# Patient Record
Sex: Male | Born: 1970 | Race: Black or African American | Hispanic: No | Marital: Married | Smoking: Never smoker
Health system: Southern US, Community
[De-identification: ages and names within clinical notes are randomized; demographics above are authoritative.]

## PROBLEM LIST (undated history)

## (undated) DIAGNOSIS — R011 Cardiac murmur, unspecified: Secondary | ICD-10-CM

## (undated) DIAGNOSIS — Z8719 Personal history of other diseases of the digestive system: Secondary | ICD-10-CM

## (undated) DIAGNOSIS — K219 Gastro-esophageal reflux disease without esophagitis: Secondary | ICD-10-CM

## (undated) DIAGNOSIS — M1990 Unspecified osteoarthritis, unspecified site: Secondary | ICD-10-CM

## (undated) HISTORY — PX: APPENDECTOMY: SHX54

## (undated) HISTORY — PX: WRIST SURGERY: SHX841

## (undated) HISTORY — PX: SHOULDER SURGERY: SHX246

## (undated) HISTORY — PX: FOOT SURGERY: SHX648

---

## 2007-02-05 ENCOUNTER — Emergency Department: Payer: Self-pay | Admitting: Unknown Physician Specialty

## 2007-06-20 ENCOUNTER — Emergency Department: Payer: Self-pay | Admitting: Emergency Medicine

## 2007-12-16 ENCOUNTER — Emergency Department: Payer: Self-pay | Admitting: Emergency Medicine

## 2008-01-14 ENCOUNTER — Emergency Department: Payer: Self-pay | Admitting: Emergency Medicine

## 2008-04-20 ENCOUNTER — Emergency Department: Payer: Self-pay | Admitting: Emergency Medicine

## 2009-11-07 ENCOUNTER — Emergency Department: Payer: Self-pay | Admitting: Emergency Medicine

## 2010-09-03 ENCOUNTER — Emergency Department: Payer: Self-pay | Admitting: Emergency Medicine

## 2011-05-02 ENCOUNTER — Emergency Department: Payer: Self-pay | Admitting: Internal Medicine

## 2012-02-06 ENCOUNTER — Emergency Department: Payer: Self-pay | Admitting: Emergency Medicine

## 2012-12-25 ENCOUNTER — Emergency Department: Payer: Self-pay | Admitting: Emergency Medicine

## 2013-01-29 ENCOUNTER — Emergency Department: Payer: Self-pay | Admitting: Emergency Medicine

## 2013-06-24 ENCOUNTER — Emergency Department: Payer: Self-pay | Admitting: Emergency Medicine

## 2013-11-15 ENCOUNTER — Emergency Department: Payer: Self-pay | Admitting: Emergency Medicine

## 2014-10-10 ENCOUNTER — Emergency Department: Payer: Self-pay | Admitting: Emergency Medicine

## 2015-02-10 ENCOUNTER — Emergency Department
Admission: EM | Admit: 2015-02-10 | Discharge: 2015-02-10 | Disposition: A | Discharge status: Home / Self Care | Payer: No Typology Code available for payment source | Attending: Emergency Medicine | Admitting: Emergency Medicine

## 2015-02-10 ENCOUNTER — Emergency Department: Payer: No Typology Code available for payment source

## 2015-02-10 DIAGNOSIS — Y998 Other external cause status: Secondary | ICD-10-CM | POA: Insufficient documentation

## 2015-02-10 DIAGNOSIS — Y92410 Unspecified street and highway as the place of occurrence of the external cause: Secondary | ICD-10-CM | POA: Diagnosis not present

## 2015-02-10 DIAGNOSIS — S199XXA Unspecified injury of neck, initial encounter: Secondary | ICD-10-CM | POA: Diagnosis present

## 2015-02-10 DIAGNOSIS — Y9389 Activity, other specified: Secondary | ICD-10-CM | POA: Diagnosis not present

## 2015-02-10 DIAGNOSIS — S139XXA Sprain of joints and ligaments of unspecified parts of neck, initial encounter: Secondary | ICD-10-CM | POA: Diagnosis not present

## 2015-02-10 MED ORDER — OXYCODONE-ACETAMINOPHEN 5-325 MG PO TABS
1.0000 | ORAL_TABLET | Freq: Once | ORAL | Status: AC
  Administered 2015-02-10: 1 via ORAL

## 2015-02-10 MED ORDER — IBUPROFEN 800 MG PO TABS: 800.0000 mg | ORAL_TABLET | Freq: Three times a day (TID) | ORAL | Status: DC | PRN

## 2015-02-10 MED ORDER — HYDROCODONE-ACETAMINOPHEN 5-325 MG PO TABS: 1.0000 | ORAL_TABLET | ORAL | Status: DC | PRN

## 2015-02-10 MED ORDER — KETOROLAC TROMETHAMINE 60 MG/2ML IM SOLN
INTRAMUSCULAR | Status: AC
  Filled 2015-02-10: qty 2

## 2015-02-10 MED ORDER — OXYCODONE-ACETAMINOPHEN 5-325 MG PO TABS
ORAL_TABLET | ORAL | Status: AC
  Filled 2015-02-10: qty 1

## 2015-02-10 MED ORDER — KETOROLAC TROMETHAMINE 60 MG/2ML IM SOLN
60.0000 mg | Freq: Once | INTRAMUSCULAR | Status: AC
  Administered 2015-02-10: 60 mg via INTRAMUSCULAR

## 2015-02-10 MED ORDER — CYCLOBENZAPRINE HCL 10 MG PO TABS: 10.0000 mg | ORAL_TABLET | Freq: Three times a day (TID) | ORAL | Status: AC | PRN

## 2015-02-10 NOTE — ED Notes (Signed)
Pt reports c/o "constant ringing in ears". Pt denies blurry vision and nausea at this time.
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
## 2015-02-15 ENCOUNTER — Emergency Department
Admission: EM | Admit: 2015-02-15 | Discharge: 2015-02-15 | Disposition: A | Discharge status: Home / Self Care | Payer: No Typology Code available for payment source | Attending: Student | Admitting: Student

## 2015-02-15 ENCOUNTER — Encounter: Payer: Self-pay | Admitting: Emergency Medicine

## 2015-02-15 ENCOUNTER — Emergency Department: Payer: No Typology Code available for payment source

## 2015-02-15 DIAGNOSIS — Z79899 Other long term (current) drug therapy: Secondary | ICD-10-CM | POA: Insufficient documentation

## 2015-02-15 DIAGNOSIS — S3992XD Unspecified injury of lower back, subsequent encounter: Secondary | ICD-10-CM | POA: Insufficient documentation

## 2015-02-15 DIAGNOSIS — M545 Low back pain, unspecified: Secondary | ICD-10-CM

## 2015-02-15 MED ORDER — TRAMADOL HCL 50 MG PO TABS: 50.0000 mg | ORAL_TABLET | Freq: Four times a day (QID) | ORAL | Status: DC | PRN

## 2015-02-15 MED ORDER — METHOCARBAMOL 750 MG PO TABS: 1500.0000 mg | ORAL_TABLET | Freq: Four times a day (QID) | ORAL | Status: DC
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
## 2015-06-12 ENCOUNTER — Encounter: Payer: Self-pay | Admitting: Emergency Medicine

## 2015-06-12 ENCOUNTER — Emergency Department
Admission: EM | Admit: 2015-06-12 | Discharge: 2015-06-12 | Disposition: A | Discharge status: Home / Self Care | Payer: Self-pay | Attending: Emergency Medicine | Admitting: Emergency Medicine

## 2015-06-12 ENCOUNTER — Emergency Department: Payer: Self-pay

## 2015-06-12 DIAGNOSIS — R112 Nausea with vomiting, unspecified: Secondary | ICD-10-CM | POA: Insufficient documentation

## 2015-06-12 DIAGNOSIS — R197 Diarrhea, unspecified: Secondary | ICD-10-CM | POA: Insufficient documentation

## 2015-06-12 DIAGNOSIS — Z79899 Other long term (current) drug therapy: Secondary | ICD-10-CM | POA: Insufficient documentation

## 2015-06-12 DIAGNOSIS — R1031 Right lower quadrant pain: Secondary | ICD-10-CM | POA: Insufficient documentation

## 2015-06-12 HISTORY — DX: Personal history of other diseases of the digestive system: Z87.19

## 2015-06-12 LAB — COMPREHENSIVE METABOLIC PANEL
ALT: 18 U/L (ref 17–63)
AST: 28 U/L (ref 15–41)
Albumin: 4.3 g/dL (ref 3.5–5.0)
Alkaline Phosphatase: 64 U/L (ref 38–126)
Anion gap: 3 — ABNORMAL LOW (ref 5–15)
BUN: 14 mg/dL (ref 6–20)
CO2: 32 mmol/L (ref 22–32)
Calcium: 9.5 mg/dL (ref 8.9–10.3)
Chloride: 107 mmol/L (ref 101–111)
Creatinine, Ser: 1.18 mg/dL (ref 0.61–1.24)
GFR calc Af Amer: >60 mL/min (ref >60)
GFR calc non Af Amer: >60 mL/min (ref >60)
Glucose, Bld: 97 mg/dL (ref 65–99)
Potassium: 4.7 mmol/L (ref 3.5–5.1)
Sodium: 142 mmol/L (ref 135–145)
Total Bilirubin: 0.7 mg/dL (ref 0.3–1.2)
Total Protein: 7.2 g/dL (ref 6.5–8.1)

## 2015-06-12 LAB — CBC
HCT: 43.2 % (ref 40.0–52.0)
Hemoglobin: 14.1 g/dL (ref 13.0–18.0)
MCH: 28.8 pg (ref 26.0–34.0)
MCHC: 32.5 g/dL (ref 32.0–36.0)
MCV: 88.4 fL (ref 80.0–100.0)
Platelets: 170 10*3/uL (ref 150–440)
RBC: 4.89 MIL/uL (ref 4.40–5.90)
RDW: 13.6 % (ref 11.5–14.5)
WBC: 3.3 10*3/uL — ABNORMAL LOW (ref 3.8–10.6)

## 2015-06-12 LAB — URINALYSIS COMPLETE WITH MICROSCOPIC (ARMC ONLY)
Bacteria, UA: NONE SEEN
Bilirubin Urine: NEGATIVE
Glucose, UA: NEGATIVE mg/dL
Ketones, ur: NEGATIVE mg/dL
Leukocytes, UA: NEGATIVE
Nitrite: NEGATIVE
Protein, ur: NEGATIVE mg/dL
Specific Gravity, Urine: 1.021 (ref 1.005–1.030)
Squamous Epithelial / LPF: NONE SEEN
pH: 6 (ref 5.0–8.0)

## 2015-06-12 LAB — LIPASE, BLOOD: Lipase: 34 U/L (ref 22–51)

## 2015-06-12 MED ORDER — IOHEXOL 240 MG/ML SOLN
25.0000 mL | Freq: Once | INTRAMUSCULAR | Status: AC | PRN
  Administered 2015-06-12: 50 mL via ORAL

## 2015-06-12 MED ORDER — HYDROCODONE-ACETAMINOPHEN 5-325 MG PO TABS: 1.0000 | ORAL_TABLET | ORAL | Status: DC | PRN

## 2015-06-12 MED ORDER — IOHEXOL 300 MG/ML  SOLN
100.0000 mL | Freq: Once | INTRAMUSCULAR | Status: AC | PRN
  Administered 2015-06-12: 100 mL via INTRAVENOUS

## 2015-06-12 NOTE — ED Provider Notes (Signed)
[facility] Emergency Department Provider Note  ____________________________________________  Time seen: Approximately 8:00 AM  I have reviewed the triage vital signs and the nursing notes.   HISTORY  Chief Complaint Abdominal Pain; Emesis; and Diarrhea    HPI Andrew Sutton is a 44 y.o. male with a history of an appendectomy and who was told he has Crohn's disease at that time but never followed up with any other physician.He presents today with 2 days of intermittent but worsening right lower quadrant abdominal pain that he describes as sharp and severe.  He states that it occurs suddenly/acutely and lasts for a couple of minutes before it resolves, but it happens every 10 minutes or so.  It is accompanied with nausea but no vomiting.  He has also had multiple episodes of diarrhea recently.  He notes that there has been some blood in his stool when he wipes.  He states that he frequently has abdominal pain but the pain over the last couple days has been worse.  He denies fever/chills, chest pain, shortness of breath, dysuria.   Past Medical History  Diagnosis Date  . H/O Crohn's disease     diagnosed around age 12, but never follow up    There are no active problems to display for this patient.   Past Surgical History  Procedure Laterality Date  . Appendectomy      Current Outpatient Rx  Name  Route  Sig  Dispense  Refill  . cyclobenzaprine (FLEXERIL) 10 MG tablet   Oral   Take 1 tablet (10 mg total) by mouth every 8 (eight) hours as needed for muscle spasms.   30 tablet   1   .           . ibuprofen (ADVIL,MOTRIN) 800 MG tablet   Oral   Take 1 tablet (800 mg total) by mouth every 8 (eight) hours as needed.   15 tablet   0   . methocarbamol (ROBAXIN-750) 750 MG tablet   Oral   Take 2 tablets (1,500 mg total) by mouth 4 (four) times daily.   40 tablet   0     Allergies Review of patient's allergies indicates no known allergies.  No  family history on file.  Social History Social History  Substance Use Topics  . Smoking status: Never Smoker   . Smokeless tobacco: Never Used  . Alcohol Use: No    Review of Systems Constitutional: No fever/chills Eyes: No visual changes. ENT: No sore throat. Cardiovascular: Denies chest pain. Respiratory: Denies shortness of breath. Gastrointestinal: Acute onset intermittent sharp stabbing right lower quadrant abdominal pain with nausea but no vomiting and recent diarrhea with blood noted in the stool. Genitourinary: Negative for dysuria. Musculoskeletal: Negative for back pain. Skin: Negative for rash. Neurological: Negative for headaches, focal weakness or numbness.  10-point ROS otherwise negative.  ____________________________________________   PHYSICAL EXAM:  VITAL SIGNS: ED Triage Vitals  Enc Vitals Group     BP 06/12/15 0714 113/82 mmHg     Pulse Rate 06/12/15 0714 64     Resp 06/12/15 0714 18     Temp 06/12/15 0714 97.8 F (36.6 C)     Temp Source 06/12/15 0714 Oral     SpO2 06/12/15 0714 100 %     Weight 06/12/15 0714 180 lb (81.647 kg)     Height 06/12/15 0714 6\' 3"  (1.905 m)     Head Cir --      Peak Flow --  Pain Score 06/12/15 0717 7     Pain Loc --      Pain Edu? --      Excl. in [place]? --     Constitutional: Alert and oriented. Well appearing and in no acute distress. Eyes: Conjunctivae are normal. PERRL. EOMI. Head: Atraumatic. Nose: No congestion/rhinnorhea. Mouth/Throat: Mucous membranes are moist.  Oropharynx non-erythematous. Neck: No stridor.   Cardiovascular: Normal rate, regular rhythm. Grossly normal heart sounds.  Good peripheral circulation. Respiratory: Normal respiratory effort.  No retractions. Lungs CTAB. Gastrointestinal: Normal habitus/thin.  Soft but with moderate tenderness to palpation of the right lower quadrant.  No rebound or guarding. Rectal:  Normal external exam with no evidence of fistula.  Mild tenderness to  palpation.  No significant amount of stool in the vault.  Hemoccult negative.  Quality control passed. Musculoskeletal: No lower extremity tenderness nor edema.  No joint effusions. Neurologic:  Normal speech and language. No gross focal neurologic deficits are appreciated.  Skin:  Skin is warm, dry and intact. No rash noted. Psychiatric: Mood and affect are normal. Speech and behavior are normal.  ____________________________________________   LABS (all labs ordered are listed, but only abnormal results are displayed)  Labs Reviewed  COMPREHENSIVE METABOLIC PANEL - Abnormal; Notable for the following:    Anion gap 3 (*)    All other components within normal limits  CBC - Abnormal; Notable for the following:    WBC 3.3 (*)    All other components within normal limits  URINALYSIS COMPLETEWITH MICROSCOPIC ([facility] ONLY) - Abnormal; Notable for the following:    Color, Urine YELLOW (*)    APPearance CLEAR (*)    Hgb urine dipstick 1+ (*)    All other components within normal limits  LIPASE, BLOOD   ____________________________________________  EKG  Not indicated ____________________________________________  RADIOLOGY   Ct Abdomen Pelvis W Contrast  06/12/2015  CLINICAL DATA:  44 year old male with right lower quadrant abdominal pain for the past 2 days. Nausea, vomiting, diarrhea and bloody stool. History of Crohn's disease. Status post appendectomy. EXAM: CT ABDOMEN AND PELVIS WITH CONTRAST TECHNIQUE: Multidetector CT imaging of the abdomen and pelvis was performed using the standard protocol following bolus administration of intravenous contrast. CONTRAST:  144mL OMNIPAQUE IOHEXOL 300 MG/ML  SOLN COMPARISON:  No priors. FINDINGS: Lower chest:  Unremarkable. Hepatobiliary: No suspicious cystic or solid hepatic lesions are identified. Sub cm low-attenuation lesion in segment 7 of the liver is too small to definitively characterize, but is statistically likely a tiny cyst. No intra or  extrahepatic biliary ductal dilatation. Gallbladder is normal in appearance. Pancreas: No pancreatic mass. No pancreatic ductal dilatation. No pancreatic or peripancreatic fluid or inflammatory changes. Spleen: Unremarkable. Adrenals/Urinary Tract: Normal appearance of the kidneys and bilateral adrenal glands. No hydroureteronephrosis. Urinary bladder is normal in appearance. Stomach/Bowel: Normal appearance of the stomach. No pathologic dilatation of small bowel or colon. Status post appendectomy. The terminal ileum is well visualized and is unremarkable in appearance. Specifically, no overt wall thickening, surrounding inflammatory changes or associated hypervascularity of the mucosa or adjacent mesentery. No definite focal areas of mural thickening or surrounding inflammatory changes associated with the small bowel or colon on today's examination. Vascular/Lymphatic: No significant atherosclerotic disease, aneurysm or dissection identified in the abdominal or pelvic vasculature. Reproductive: No lymphadenopathy noted in the abdomen or pelvis. Other: Trace volume of ascites in the low anatomic pelvis. No pneumoperitoneum. Musculoskeletal: There are no aggressive appearing lytic or blastic lesions noted in the visualized portions of  the skeleton. IMPRESSION: 1. Trace volume of ascites in the low anatomic pelvis. The origin of this ascites is uncertain on today's examination, as there are no overt areas of active inflammation associated with the small bowel or colon and no other potential source for ascites noted. 2. Status post appendectomy. Electronically Signed   By: Vinnie Langton M.D.   On: 06/12/2015 11:12    ____________________________________________   PROCEDURES  Procedure(s) performed: None  Critical Care performed: No ____________________________________________   INITIAL IMPRESSION / ASSESSMENT AND PLAN / ED COURSE  Pertinent labs & imaging results that were available during my care of  the patient were reviewed by me and considered in my medical decision making (see chart for details).  Moderate tenderness to palpation of the right lower quadrant in the setting of a diagnosis of Crohn's disease that was never followed up.  He has had an appendectomy but given the tenderness, the recent episodes of diarrhea, and the blood in the stool, I will obtain a CT scan of his abdomen/pelvis for further evaluation.  Given that he has stable vitals, I suspect that he will be able to follow up with GI as an outpatient, but we will evaluate with labs and imaging.  ----------------------------------------- 12:00 PM on 06/12/2015 -----------------------------------------  Patient states he feels better and is hungry.  Workup is unremarkable.  CT as nonacute although there is some trace fluid of uncertain origin.  I explained to the patient is very important that he follow up with GI to evaluate his questionable history of Crohn's disease.  I gave him my usual and customary return precautions.  ____________________________________________  FINAL CLINICAL IMPRESSION(S) / ED DIAGNOSES  Final diagnoses:  RLQ abdominal pain      NEW MEDICATIONS STARTED DURING THIS VISIT:  Discharge Medication List as of 06/12/2015 12:06 PM       Hinda Kehr, MD 06/12/15 1547

## 2015-06-12 NOTE — ED Notes (Signed)
CT notified patient is finished with contrast.

## 2015-06-12 NOTE — ED Notes (Signed)
Pt report RLQ abd pain with vomiting and diarrhea for past 2 days. Pt reports blood " when I wipe". Denies fever. Vomited once today and diarrhea multiple time today.

## 2015-06-12 NOTE — Discharge Instructions (Signed)
You have been seen in the Emergency Department (ED) for abdominal pain.  Your evaluation did not identify a clear cause of your symptoms but was generally reassuring. ° °Please follow up as instructed above regarding today’s emergent visit and the symptoms that are bothering you. ° °Return to the ED if your abdominal pain worsens or fails to improve, you develop bloody vomiting, bloody diarrhea, you are unable to tolerate fluids due to vomiting, fever greater than 101, or other symptoms that concern you. ° ° °Abdominal Pain, Adult °Many things can cause abdominal pain. Usually, abdominal pain is not caused by a disease and will improve without treatment. It can often be observed and treated at home. Your health care provider will do a physical exam and possibly order blood tests and X-rays to help determine the seriousness of your pain. However, in many cases, more time must pass before a clear cause of the pain can be found. Before that point, your health care provider may not know if you need more testing or further treatment. °HOME CARE INSTRUCTIONS °Monitor your abdominal pain for any changes. The following actions may help to alleviate any discomfort you are experiencing: °· Only take over-the-counter or prescription medicines as directed by your health care provider. °· Do not take laxatives unless directed to do so by your health care provider. °· Try a clear liquid diet (broth, tea, or water) as directed by your health care provider. Slowly move to a bland diet as tolerated. °SEEK MEDICAL CARE IF: °· You have unexplained abdominal pain. °· You have abdominal pain associated with nausea or diarrhea. °· You have pain when you urinate or have a bowel movement. °· You experience abdominal pain that wakes you in the night. °· You have abdominal pain that is worsened or improved by eating food. °· You have abdominal pain that is worsened with eating fatty foods. °· You have a fever. °SEEK IMMEDIATE MEDICAL CARE  IF: °· Your pain does not go away within 2 hours. °· You keep throwing up (vomiting). °· Your pain is felt only in portions of the abdomen, such as the right side or the left lower portion of the abdomen. °· You pass bloody or black tarry stools. °MAKE SURE YOU: °· Understand these instructions. °· Will watch your condition. °· Will get help right away if you are not doing well or get worse. °  °This information is not intended to replace advice given to you by your health care provider. Make sure you discuss any questions you have with your health care provider. °  °Document Released: 05/23/2005 Document Revised: 05/04/2015 Document Reviewed: 04/22/2013 °Elsevier Interactive Patient Education ©2016 [employer] ° °

## 2016-01-08 ENCOUNTER — Emergency Department: Payer: BLUE CROSS/BLUE SHIELD

## 2016-01-08 ENCOUNTER — Encounter: Payer: Self-pay | Admitting: Urgent Care

## 2016-01-08 ENCOUNTER — Emergency Department
Admission: EM | Admit: 2016-01-08 | Discharge: 2016-01-08 | Disposition: A | Discharge status: Home / Self Care | Payer: BLUE CROSS/BLUE SHIELD | Attending: Emergency Medicine | Admitting: Emergency Medicine

## 2016-01-08 DIAGNOSIS — F1290 Cannabis use, unspecified, uncomplicated: Secondary | ICD-10-CM | POA: Diagnosis not present

## 2016-01-08 DIAGNOSIS — Z79899 Other long term (current) drug therapy: Secondary | ICD-10-CM | POA: Insufficient documentation

## 2016-01-08 DIAGNOSIS — R1031 Right lower quadrant pain: Secondary | ICD-10-CM | POA: Diagnosis present

## 2016-01-08 LAB — URINALYSIS COMPLETE WITH MICROSCOPIC (ARMC ONLY)
Bilirubin Urine: NEGATIVE
Glucose, UA: NEGATIVE mg/dL
Hgb urine dipstick: NEGATIVE
Ketones, ur: NEGATIVE mg/dL
Leukocytes, UA: NEGATIVE
Nitrite: NEGATIVE
Protein, ur: NEGATIVE mg/dL
RBC / HPF: NONE SEEN RBC/hpf (ref 0–5)
Specific Gravity, Urine: 1.024 (ref 1.005–1.030)
Squamous Epithelial / LPF: NONE SEEN
WBC, UA: NONE SEEN WBC/hpf (ref 0–5)
pH: 5 (ref 5.0–8.0)

## 2016-01-08 LAB — CBC
HCT: 40.4 % (ref 40.0–52.0)
Hemoglobin: 13.3 g/dL (ref 13.0–18.0)
MCH: 28.8 pg (ref 26.0–34.0)
MCHC: 32.8 g/dL (ref 32.0–36.0)
MCV: 87.8 fL (ref 80.0–100.0)
Platelets: 188 10*3/uL (ref 150–440)
RBC: 4.6 MIL/uL (ref 4.40–5.90)
RDW: 13.5 % (ref 11.5–14.5)
WBC: 4.3 10*3/uL (ref 3.8–10.6)

## 2016-01-08 LAB — COMPREHENSIVE METABOLIC PANEL
ALT: 15 U/L — ABNORMAL LOW (ref 17–63)
AST: 22 U/L (ref 15–41)
Albumin: 4 g/dL (ref 3.5–5.0)
Alkaline Phosphatase: 58 U/L (ref 38–126)
Anion gap: 7 (ref 5–15)
BUN: 15 mg/dL (ref 6–20)
CO2: 25 mmol/L (ref 22–32)
Calcium: 9.1 mg/dL (ref 8.9–10.3)
Chloride: 109 mmol/L (ref 101–111)
Creatinine, Ser: 1.16 mg/dL (ref 0.61–1.24)
GFR calc Af Amer: >60 mL/min (ref >60)
GFR calc non Af Amer: >60 mL/min (ref >60)
Glucose, Bld: 85 mg/dL (ref 65–99)
Potassium: 4.1 mmol/L (ref 3.5–5.1)
Sodium: 141 mmol/L (ref 135–145)
Total Bilirubin: 0.7 mg/dL (ref 0.3–1.2)
Total Protein: 6.6 g/dL (ref 6.5–8.1)

## 2016-01-08 LAB — CHLAMYDIA/NGC RT PCR (ARMC ONLY)
Chlamydia Tr: NOT DETECTED
N gonorrhoeae: NOT DETECTED

## 2016-01-08 MED ORDER — OXYCODONE-ACETAMINOPHEN 5-325 MG PO TABS
2.0000 | ORAL_TABLET | Freq: Once | ORAL | Status: AC
  Administered 2016-01-08: 2 via ORAL
  Filled 2016-01-08: qty 2

## 2016-01-08 MED ORDER — HYDROCODONE-ACETAMINOPHEN 5-325 MG PO TABS: 1.0000 | ORAL_TABLET | ORAL | Status: DC | PRN

## 2016-01-08 NOTE — ED Notes (Signed)
MD Paduchowski at bedside  

## 2016-01-08 NOTE — ED Notes (Signed)
Reviewed d/c instructions, follow-up care, prescriptions with pt. Pt verbalized understanding.

## 2016-01-08 NOTE — Discharge Instructions (Signed)
You have been seen in the emergency department today for abdominal pain. Your workup has not shown any acute cause for your discomfort. Please follow-up with a primary care physician as soon as possible by calling the number provided to arrange an appointment. Return to the emergency department for any worsening pain, fever, or any other symptom personally concerning to you your self.   Abdominal Pain, Adult Many things can cause abdominal pain. Usually, abdominal pain is not caused by a disease and will improve without treatment. It can often be observed and treated at home. Your health care provider will do a physical exam and possibly order blood tests and X-rays to help determine the seriousness of your pain. However, in many cases, more time must pass before a clear cause of the pain can be found. Before that point, your health care provider may not know if you need more testing or further treatment. HOME CARE INSTRUCTIONS Monitor your abdominal pain for any changes. The following actions may help to alleviate any discomfort you are experiencing:  Only take over-the-counter or prescription medicines as directed by your health care provider.  Do not take laxatives unless directed to do so by your health care provider.  Try a clear liquid diet (broth, tea, or water) as directed by your health care provider. Slowly move to a bland diet as tolerated. SEEK MEDICAL CARE IF:  You have unexplained abdominal pain.  You have abdominal pain associated with nausea or diarrhea.  You have pain when you urinate or have a bowel movement.  You experience abdominal pain that wakes you in the night.  You have abdominal pain that is worsened or improved by eating food.  You have abdominal pain that is worsened with eating fatty foods.  You have a fever. SEEK IMMEDIATE MEDICAL CARE IF:  Your pain does not go away within 2 hours.  You keep throwing up (vomiting).  Your pain is felt only in portions  of the abdomen, such as the right side or the left lower portion of the abdomen.  You pass bloody or black tarry stools. MAKE SURE YOU:  Understand these instructions.  Will watch your condition.  Will get help right away if you are not doing well or get worse.   This information is not intended to replace advice given to you by your health care provider. Make sure you discuss any questions you have with your health care provider.   Document Released: 05/23/2005 Document Revised: 05/04/2015 Document Reviewed: 04/22/2013 Elsevier Interactive Patient Education [employer].

## 2016-01-08 NOTE — ED Notes (Signed)
Patient presents with RIGHT flank pain for over a month. Denies accompanying symptoms. PMH signifcant for Crohn's and gonorrhea; reports that this doesn't feel like either.

## 2016-01-08 NOTE — ED Notes (Signed)
Pt c/o of right flank pain rated at an 8 out of 10. Pt had pain for approx 1 month with increasing severity tonight. Pt has hx of chronns disease. Pt reports discomfort with urination. Pt denies changes in urinary stream. Pt reports an increase in the looseness and frequency of his bowel movements.

## 2016-01-08 NOTE — ED Notes (Signed)
Patient transported to CT 

## 2016-01-08 NOTE — ED Provider Notes (Signed)
[facility] Emergency Department Provider Note  Time seen: 12:55 AM  I have reviewed the triage vital signs and the nursing notes.   HISTORY  Chief Complaint Abdominal Pain    HPI SHANNE HAMS is a 45 y.o. male with a past medical history of Crohn's disease presents to the emergency department right flank pain. According to the patient for the past 3-4 weeks she has been experiencing pain in his right flank which she describes as mostly right lower quadrant. Patient has had an appendectomy approximately 20 years ago. Patient states he has Crohn's disease, but states he knows what a Crohn's flare feels like and states this is not his Crohn's. Denies any diarrhea, black or bloody stool. Has a history of gonorrhea in the past but states this does not feel like gonorrhea. The patient does state a slight discomfort with urination. Describes his pain as a dull aching pain in the right flank currently 8/10.     Past Medical History  Diagnosis Date  . H/O Crohn's disease     diagnosed around age 28, but never follow up    There are no active problems to display for this patient.   Past Surgical History  Procedure Laterality Date  . Appendectomy    . Shoulder surgery    . Wrist surgery      Current Outpatient Rx  Name  Route  Sig  Dispense  Refill  . cyclobenzaprine (FLEXERIL) 10 MG tablet   Oral   Take 1 tablet (10 mg total) by mouth every 8 (eight) hours as needed for muscle spasms.   30 tablet   1   . HYDROcodone-acetaminophen (NORCO/VICODIN) 5-325 MG tablet   Oral   Take 1-2 tablets by mouth every 4 (four) hours as needed for moderate pain.   15 tablet   0   . ibuprofen (ADVIL,MOTRIN) 800 MG tablet   Oral   Take 1 tablet (800 mg total) by mouth every 8 (eight) hours as needed.   15 tablet   0   . methocarbamol (ROBAXIN-750) 750 MG tablet   Oral   Take 2 tablets (1,500 mg total) by mouth 4 (four) times daily.   40 tablet   0      Allergies Review of patient's allergies indicates no known allergies.  No family history on file.  Social History Social History  Substance Use Topics  . Smoking status: Never Smoker   . Smokeless tobacco: Never Used  . Alcohol Use: No    Review of Systems Constitutional: Negative for fever. Cardiovascular: Negative for chest pain. Respiratory: Negative for shortness of breath. Gastrointestinal: Right lower quadrant/right flank pain. Negative for nausea, vomiting, diarrhea Genitourinary: Mild dysuria. Denies hematuria. Denies penile discharge. Musculoskeletal: Right flank pain Neurological: Negative for headache 10-point ROS otherwise negative.  ____________________________________________   PHYSICAL EXAM:  VITAL SIGNS: ED Triage Vitals  Enc Vitals Group     BP 01/08/16 0040 119/81 mmHg     Pulse Rate 01/08/16 0040 55     Resp 01/08/16 0040 16     Temp 01/08/16 0040 98.2 F (36.8 C)     Temp Source 01/08/16 0040 Oral     SpO2 01/08/16 0040 100 %     Weight 01/08/16 0040 180 lb (81.647 kg)     Height --      Head Cir --      Peak Flow --      Pain Score 01/08/16 0041 8  Pain Loc --      Pain Edu? --      Excl. in [place]? --     Constitutional: Alert and oriented. Well appearing and in no distress. Eyes: Normal exam ENT   Head: Normocephalic and atraumatic.   Mouth/Throat: Mucous membranes are moist. Cardiovascular: Normal rate, regular rhythm. No murmur Respiratory: Normal respiratory effort without tachypnea nor retractions. Breath sounds are clear  Gastrointestinal: Soft and nontender. No distention.  There is no CVA tenderness. Musculoskeletal: Nontender with normal range of motion in all extremities.  Neurologic:  Normal speech and language. No gross focal neurologic deficits  Skin:  Skin is warm, dry and intact.  Psychiatric: Mood and affect are normal.  ____________________________________________     RADIOLOGY  CT shows no acute  finding  ____________________________________________   INITIAL IMPRESSION / ASSESSMENT AND PLAN / ED COURSE  Pertinent labs & imaging results that were available during my care of the patient were reviewed by me and considered in my medical decision making (see chart for details).  The patient presents the emergency department with right flank pain for 3-4 weeks. Patient states 8/10 pain currently. Patient appears very well, largely nontender exam, no pain elicited. No CVA tenderness. We will treat the patient's discomfort, check labs including blood work and urinalysis and proceed with a CT renal scan to evaluate the patient's right flank.  Labs are largely within normal limits. CT shows no acute findings to explain the patient's discomfort. We will have the patient follow-up with the primary care physician. We'll place on a short course of pain medication. I discussed my normal abdominal pain return precautions to which he is agreeable.  ____________________________________________   FINAL CLINICAL IMPRESSION(S) / ED DIAGNOSES  Right flank pain   Harvest Dark, MD 01/08/16 0201

## 2016-02-20 ENCOUNTER — Emergency Department
Admission: EM | Admit: 2016-02-20 | Discharge: 2016-02-20 | Disposition: A | Discharge status: Home / Self Care | Payer: No Typology Code available for payment source | Attending: Emergency Medicine | Admitting: Emergency Medicine

## 2016-02-20 ENCOUNTER — Encounter: Payer: Self-pay | Admitting: Emergency Medicine

## 2016-02-20 DIAGNOSIS — F1290 Cannabis use, unspecified, uncomplicated: Secondary | ICD-10-CM | POA: Insufficient documentation

## 2016-02-20 DIAGNOSIS — Z79899 Other long term (current) drug therapy: Secondary | ICD-10-CM | POA: Insufficient documentation

## 2016-02-20 DIAGNOSIS — Z8719 Personal history of other diseases of the digestive system: Secondary | ICD-10-CM | POA: Insufficient documentation

## 2016-02-20 DIAGNOSIS — R197 Diarrhea, unspecified: Secondary | ICD-10-CM | POA: Insufficient documentation

## 2016-02-20 DIAGNOSIS — R1030 Lower abdominal pain, unspecified: Secondary | ICD-10-CM

## 2016-02-20 DIAGNOSIS — Z791 Long term (current) use of non-steroidal anti-inflammatories (NSAID): Secondary | ICD-10-CM | POA: Insufficient documentation

## 2016-02-20 LAB — LIPASE, BLOOD: Lipase: 23 U/L (ref 11–51)

## 2016-02-20 LAB — CBC
HCT: 37.6 % — ABNORMAL LOW (ref 40.0–52.0)
Hemoglobin: 13.2 g/dL (ref 13.0–18.0)
MCH: 30.1 pg (ref 26.0–34.0)
MCHC: 35.2 g/dL (ref 32.0–36.0)
MCV: 85.5 fL (ref 80.0–100.0)
Platelets: 210 10*3/uL (ref 150–440)
RBC: 4.39 MIL/uL — ABNORMAL LOW (ref 4.40–5.90)
RDW: 13.5 % (ref 11.5–14.5)
WBC: 3.9 10*3/uL (ref 3.8–10.6)

## 2016-02-20 LAB — URINALYSIS COMPLETE WITH MICROSCOPIC (ARMC ONLY)
Bacteria, UA: NONE SEEN
Bilirubin Urine: NEGATIVE
Glucose, UA: NEGATIVE mg/dL
Hgb urine dipstick: NEGATIVE
Ketones, ur: NEGATIVE mg/dL
Leukocytes, UA: NEGATIVE
Nitrite: NEGATIVE
Protein, ur: NEGATIVE mg/dL
RBC / HPF: NONE SEEN RBC/hpf (ref 0–5)
Specific Gravity, Urine: 1.023 (ref 1.005–1.030)
Squamous Epithelial / LPF: NONE SEEN
WBC, UA: NONE SEEN WBC/hpf (ref 0–5)
pH: 5 (ref 5.0–8.0)

## 2016-02-20 LAB — COMPREHENSIVE METABOLIC PANEL
ALT: 14 U/L — ABNORMAL LOW (ref 17–63)
AST: 21 U/L (ref 15–41)
Albumin: 4 g/dL (ref 3.5–5.0)
Alkaline Phosphatase: 63 U/L (ref 38–126)
Anion gap: 5 (ref 5–15)
BUN: 11 mg/dL (ref 6–20)
CO2: 27 mmol/L (ref 22–32)
Calcium: 9 mg/dL (ref 8.9–10.3)
Chloride: 107 mmol/L (ref 101–111)
Creatinine, Ser: 1.28 mg/dL — ABNORMAL HIGH (ref 0.61–1.24)
GFR calc Af Amer: >60 mL/min (ref >60)
GFR calc non Af Amer: >60 mL/min (ref >60)
Glucose, Bld: 95 mg/dL (ref 65–99)
Potassium: 4 mmol/L (ref 3.5–5.1)
Sodium: 139 mmol/L (ref 135–145)
Total Bilirubin: 0.6 mg/dL (ref 0.3–1.2)
Total Protein: 6.7 g/dL (ref 6.5–8.1)

## 2016-02-20 MED ORDER — ONDANSETRON HCL 4 MG PO TABS: 4.0000 mg | ORAL_TABLET | Freq: Every day | ORAL | Status: DC | PRN

## 2016-02-20 NOTE — Discharge Instructions (Signed)

## 2016-02-20 NOTE — ED Notes (Signed)
Pt presents to ED with reports of RLQ abdominal pain with vomiting. Pt states he has Crohn's disease and is wondering if he is flare up. Pt reports diarrhea yesterday but none today.

## 2016-02-20 NOTE — ED Provider Notes (Signed)
[facility] Emergency Department Provider Note  ____________________________________________    I have reviewed the triage vital signs and the nursing notes.   HISTORY  Chief Complaint Abdominal Pain and Emesis    HPI Andrew Sutton is a 45 y.o. male who presents with complaints of moderate lower abdominal cramping. Patient reports this pain started early this a.m. and essentially improved when he had a bowel movement while waiting in the waiting room. He has no pain currently. He reports he was diagnosed with Crohn's disease and he was 45 years old but has never seen or followed up with anyone regarding this. He denies blood in his stool. No vomiting. No fevers or chills. No recent travel.     Past Medical History  Diagnosis Date  . H/O Crohn's disease     diagnosed around age 70, but never follow up    There are no active problems to display for this patient.   Past Surgical History  Procedure Laterality Date  . Appendectomy    . Shoulder surgery    . Wrist surgery      Current Outpatient Rx  Name  Route  Sig  Dispense  Refill  . HYDROcodone-acetaminophen (NORCO/VICODIN) 5-325 MG tablet   Oral   Take 1 tablet by mouth every 4 (four) hours as needed for moderate pain.   15 tablet   0   . ibuprofen (ADVIL,MOTRIN) 800 MG tablet   Oral   Take 1 tablet (800 mg total) by mouth every 8 (eight) hours as needed.   15 tablet   0   . methocarbamol (ROBAXIN-750) 750 MG tablet   Oral   Take 2 tablets (1,500 mg total) by mouth 4 (four) times daily.   40 tablet   0   . ondansetron (ZOFRAN) 4 MG tablet   Oral   Take 1 tablet (4 mg total) by mouth daily as needed for nausea or vomiting.   20 tablet   1     Allergies Review of patient's allergies indicates no known allergies.  No family history on file.  Social History Social History  Substance Use Topics  . Smoking status: Never Smoker   . Smokeless tobacco: Never Used  . Alcohol Use:  No    Review of Systems  Constitutional: Negative for fever. Eyes: Negative for redness ENT: Negative for sore throat Cardiovascular: Negative for chest pain Respiratory: Negative for shortness of breath. Gastrointestinal: As above Genitourinary: Negative for dysuria. Musculoskeletal: Negative for back pain. Skin: Negative for rash. Neurological: Negative for focal weakness Psychiatric: no anxiety    ____________________________________________   PHYSICAL EXAM:  VITAL SIGNS: ED Triage Vitals  Enc Vitals Group     BP 02/20/16 1021 114/64 mmHg     Pulse Rate 02/20/16 1021 62     Resp 02/20/16 1021 18     Temp 02/20/16 1021 98 F (36.7 C)     Temp Source 02/20/16 1021 Oral     SpO2 02/20/16 1021 99 %     Weight 02/20/16 1021 180 lb (81.647 kg)     Height 02/20/16 1021 6\' 3"  (1.905 m)     Head Cir --      Peak Flow --      Pain Score 02/20/16 1022 7     Pain Loc --      Pain Edu? --      Excl. in [place]? --      Constitutional: Alert and oriented. Well appearing and in no distress.  Eyes: Conjunctivae are normal. No erythema or injection ENT   Head: Normocephalic and atraumatic.   Mouth/Throat: Mucous membranes are moist. Cardiovascular: Normal rate, regular rhythm.  Respiratory: Normal respiratory effort without tachypnea nor retractions. Gastrointestinal: Soft and non-tender in all quadrants. No distention. There is no CVA tenderness. Genitourinary: deferred Musculoskeletal: Nontender with normal range of motion in all extremities. No lower extremity tenderness nor edema. Neurologic:  Normal speech and language. No gross focal neurologic deficits are appreciated. Skin:  Skin is warm, dry and intact. No rash noted. Psychiatric: Mood and affect are normal. Patient exhibits appropriate insight and judgment.  ____________________________________________    LABS (pertinent positives/negatives)  Labs Reviewed  COMPREHENSIVE METABOLIC PANEL - Abnormal;  Notable for the following:    Creatinine, Ser 1.28 (*)    ALT 14 (*)    All other components within normal limits  CBC - Abnormal; Notable for the following:    RBC 4.39 (*)    HCT 37.6 (*)    All other components within normal limits  URINALYSIS COMPLETEWITH MICROSCOPIC ([facility] ONLY) - Abnormal; Notable for the following:    Color, Urine YELLOW (*)    APPearance CLEAR (*)    All other components within normal limits  LIPASE, BLOOD    ____________________________________________   EKG  None  ____________________________________________    RADIOLOGY  None  ____________________________________________   PROCEDURES  Procedure(s) performed: none  Critical Care performed: none  ____________________________________________   INITIAL IMPRESSION / ASSESSMENT AND PLAN / ED COURSE  Pertinent labs & imaging results that were available during my care of the patient were reviewed by me and considered in my medical decision making (see chart for details).  Patient has had resolution of his pain after a bowel movement, he is well-appearing and in no distress and lab work is unremarkable. Given his description of Crohn's disease diagnosis and no GI follow-up I strongly recommended follow-up with our gastroenterologists  ____________________________________________   FINAL CLINICAL IMPRESSION(S) / ED DIAGNOSES  Final diagnoses:  Diarrhea, unspecified type  Lower abdominal pain          Lavonia Drafts, MD 02/20/16 1445

## 2016-02-21 ENCOUNTER — Other Ambulatory Visit: Payer: Self-pay

## 2016-02-21 ENCOUNTER — Telehealth: Payer: Self-pay

## 2016-02-21 NOTE — Telephone Encounter (Signed)
Pt scheduled for colonoscopy and EGD at [facility] on 03/02/16. Crohn's disease K50.90, diarrhea R19.7, Heartburn R12.

## 2016-02-27 ENCOUNTER — Emergency Department
Admission: EM | Admit: 2016-02-27 | Discharge: 2016-02-27 | Disposition: A | Discharge status: Home / Self Care | Payer: No Typology Code available for payment source | Attending: Emergency Medicine | Admitting: Emergency Medicine

## 2016-02-27 ENCOUNTER — Encounter: Payer: Self-pay | Admitting: Emergency Medicine

## 2016-02-27 DIAGNOSIS — S43402A Unspecified sprain of left shoulder joint, initial encounter: Secondary | ICD-10-CM | POA: Insufficient documentation

## 2016-02-27 DIAGNOSIS — Y9289 Other specified places as the place of occurrence of the external cause: Secondary | ICD-10-CM | POA: Insufficient documentation

## 2016-02-27 DIAGNOSIS — X501XXA Overexertion from prolonged static or awkward postures, initial encounter: Secondary | ICD-10-CM | POA: Insufficient documentation

## 2016-02-27 DIAGNOSIS — Y990 Civilian activity done for income or pay: Secondary | ICD-10-CM | POA: Insufficient documentation

## 2016-02-27 DIAGNOSIS — Y939 Activity, unspecified: Secondary | ICD-10-CM | POA: Insufficient documentation

## 2016-02-27 MED ORDER — KETOROLAC TROMETHAMINE 30 MG/ML IJ SOLN
30.0000 mg | Freq: Once | INTRAMUSCULAR | Status: AC
  Administered 2016-02-27: 30 mg via INTRAMUSCULAR

## 2016-02-27 MED ORDER — NAPROXEN 500 MG PO TABS: 500.0000 mg | ORAL_TABLET | Freq: Two times a day (BID) | ORAL | Status: DC

## 2016-02-27 MED ORDER — KETOROLAC TROMETHAMINE 30 MG/ML IJ SOLN
INTRAMUSCULAR | Status: AC
  Filled 2016-02-27: qty 1

## 2016-02-27 NOTE — Discharge Instructions (Signed)
Shoulder Sprain °A shoulder sprain is a partial or complete tear in one of the tough, fiber-like tissues (ligaments) in the shoulder. The ligaments in the shoulder help to hold the shoulder in place. °CAUSES °This condition may be caused by: °· A fall. °· A hit to the shoulder. °· A twist of the arm. °RISK FACTORS °This condition is more likely to develop in: °· People who play sports. °· People who have problems with balance or coordination. °SYMPTOMS °Symptoms of this condition include: °· Pain when moving the shoulder. °· Limited ability to move the shoulder. °· Swelling and tenderness on top of the shoulder. °· Warmth in the shoulder. °· A change in the shape of the shoulder. °· Redness or bruising on the shoulder. °DIAGNOSIS °This condition is diagnosed with a physical exam. During the exam, you may be asked to do simple exercises with your shoulder. You may also have imaging tests, such as X-rays, MRI, or a CT scan. These tests can show how severe the sprain is. °TREATMENT °This condition may be treated with: °· Rest. °· Pain medicine. °· Ice. °· A sling or brace. This is used to keep the arm still while the shoulder is healing. °· Physical therapy or rehabilitation exercises. These help to improve the range of motion and strength of the shoulder. °· Surgery (rare). Surgery may be needed if the sprain caused a joint to become unstable. Surgery may also be needed to reduce pain. °Some people may develop ongoing shoulder pain or lose some range of motion in the shoulder. However, most people do not develop long-term problems. °HOME CARE INSTRUCTIONS °· Rest. °· Take over-the-counter and prescription medicines only as told by your health care provider. °· If directed, apply ice to the area: °¨ Put ice in a plastic bag. °¨ Place a towel between your skin and the bag. °¨ Leave the ice on for 20 minutes, 2-3 times per day. °· If you were given a shoulder sling or brace: °¨ Wear it as told. °¨ Remove it to shower or  bathe. °¨ Move your arm only as much as told by your health care provider, but keep your hand moving to prevent swelling. °· If you were shown how to do any exercises, do them as told by your health care provider. °· Keep all follow-up visits as told by your health care provider. This is important. °SEEK MEDICAL CARE IF: °· Your pain gets worse. °· Your pain is not relieved with medicines. °· You have increased redness or swelling. °SEEK IMMEDIATE MEDICAL CARE IF: °· You have a fever. °· You cannot move your arm or shoulder. °· You develop numbness or tingling in your arms, hands, or fingers. °  °This information is not intended to replace advice given to you by your health care provider. Make sure you discuss any questions you have with your health care provider. °  °Document Released: 12/30/2008 Document Revised: 05/04/2015 Document Reviewed: 12/06/2014 °Elsevier Interactive Patient Education ©2016 [employer] ° °

## 2016-02-27 NOTE — ED Notes (Signed)
Patient presents to the ED with left shoulder pain since Thursday when moving in an odd way with a part at work.  Patient reports having shoulder surgery in January.  Patient does not want to file workman's comp. At this time.

## 2016-02-27 NOTE — ED Notes (Signed)
Pt reports having surgery on left shoulder in January , pt was at work twisted shoulder causing pain, pt is concerned he has re-injured shoulder

## 2016-02-27 NOTE — ED Provider Notes (Signed)
[facility] Emergency Department Provider Note  ____________________________________________  Time seen: On arrival  I have reviewed the triage vital signs and the nursing notes.   HISTORY  Chief Complaint Shoulder Pain    HPI Andrew Sutton is a 45 y.o. male who presents with complaints of left shoulder pain. He was at work and reports he twisted his shoulder which is cause pain. He thinks he may have reinjured his shoulder, he reports a shoulder surgery in January of this year. He reports pain when he attempts to abduct the arm past 90. No numbness or tingling. He is requesting an MRI    Past Medical History  Diagnosis Date  . H/O Crohn's disease     diagnosed around age 70, but never follow up    There are no active problems to display for this patient.   Past Surgical History  Procedure Laterality Date  . Appendectomy    . Shoulder surgery    . Wrist surgery      Current Outpatient Rx  Name  Route  Sig  Dispense  Refill  . HYDROcodone-acetaminophen (NORCO/VICODIN) 5-325 MG tablet   Oral   Take 1 tablet by mouth every 4 (four) hours as needed for moderate pain.   15 tablet   0   . ibuprofen (ADVIL,MOTRIN) 800 MG tablet   Oral   Take 1 tablet (800 mg total) by mouth every 8 (eight) hours as needed.   15 tablet   0   . methocarbamol (ROBAXIN-750) 750 MG tablet   Oral   Take 2 tablets (1,500 mg total) by mouth 4 (four) times daily.   40 tablet   0   . naproxen (NAPROSYN) 500 MG tablet   Oral   Take 1 tablet (500 mg total) by mouth 2 (two) times daily with a meal.   20 tablet   2   . ondansetron (ZOFRAN) 4 MG tablet   Oral   Take 1 tablet (4 mg total) by mouth daily as needed for nausea or vomiting.   20 tablet   1     Allergies Review of patient's allergies indicates no known allergies.  No family history on file.  Social History Social History  Substance Use Topics  . Smoking status: Never Smoker   . Smokeless  tobacco: Never Used  . Alcohol Use: No    Review of Systems  Constitutional: Negative for Dizziness      Musculoskeletal: Left shoulder pain as above Skin: Negative for rash. Neurological: Negative for headaches or focal weakness   ____________________________________________   PHYSICAL EXAM:  VITAL SIGNS: ED Triage Vitals  Enc Vitals Group     BP 02/27/16 1713 126/93 mmHg     Pulse Rate 02/27/16 1713 81     Resp 02/27/16 1713 18     Temp 02/27/16 1713 98.1 F (36.7 C)     Temp Source 02/27/16 1713 Oral     SpO2 02/27/16 1713 97 %     Weight 02/27/16 1713 180 lb (81.647 kg)     Height 02/27/16 1713 6\' 3"  (1.905 m)     Head Cir --      Peak Flow --      Pain Score 02/27/16 1713 10     Pain Loc --      Pain Edu? --      Excl. in [place]? --      Constitutional: Alert and oriented. Well appearing and in no distress. Eyes: Conjunctivae are normal.  ENT   Head: Normocephalic and atraumatic.   Mouth/Throat: Mucous membranes are moist. Cardiovascular: Normal rate, regular rhythm.  Respiratory: Normal respiratory effort without tachypnea nor retractions.  Gastrointestinal: Soft and non-tender in all quadrants. No distention. There is no CVA tenderness. Musculoskeletal: Pain with passive abduction of the left arm above 90. Pain with internal and external rotation which is mild. 2+ distal pulses. Neuro intact Neurologic:  Normal speech and language. No gross focal neurologic deficits are appreciated. Skin:  Skin is warm, dry and intact. No rash noted. Psychiatric: Mood and affect are normal. Patient exhibits appropriate insight and judgment.  ____________________________________________    LABS (pertinent positives/negatives)  Labs Reviewed - No data to display  ____________________________________________     ____________________________________________    RADIOLOGY I have personally reviewed any xrays that were ordered on this  patient: None ____________________________________________   PROCEDURES  Procedure(s) performed: none   ____________________________________________   INITIAL IMPRESSION / ASSESSMENT AND PLAN / ED COURSE  Pertinent labs & imaging results that were available during my care of the patient were reviewed by me and considered in my medical decision making (see chart for details).  Recommended follow-up with patient's surgeon for outpatient workup, Toradol IM given and sling  ____________________________________________   FINAL CLINICAL IMPRESSION(S) / ED DIAGNOSES  Final diagnoses:  Shoulder sprain, left, initial encounter     Lavonia Drafts, MD 02/27/16 2234

## 2016-03-02 ENCOUNTER — Encounter: Admission: RE | Payer: Self-pay | Source: Ambulatory Visit

## 2016-03-02 ENCOUNTER — Ambulatory Visit
Admission: RE | Admit: 2016-03-02 | Payer: No Typology Code available for payment source | Source: Ambulatory Visit | Admitting: Gastroenterology

## 2016-03-02 SURGERY — COLONOSCOPY WITH PROPOFOL
Anesthesia: Choice

## 2016-03-14 ENCOUNTER — Other Ambulatory Visit: Payer: Self-pay

## 2016-03-14 ENCOUNTER — Ambulatory Visit: Payer: Self-pay | Admitting: Gastroenterology

## 2016-03-26 DIAGNOSIS — M75101 Unspecified rotator cuff tear or rupture of right shoulder, not specified as traumatic: Secondary | ICD-10-CM | POA: Insufficient documentation

## 2016-03-26 DIAGNOSIS — M7580 Other shoulder lesions, unspecified shoulder: Secondary | ICD-10-CM | POA: Insufficient documentation

## 2016-05-09 ENCOUNTER — Emergency Department
Admission: EM | Admit: 2016-05-09 | Discharge: 2016-05-09 | Disposition: A | Discharge status: Home / Self Care | Payer: Worker's Compensation | Attending: Emergency Medicine | Admitting: Emergency Medicine

## 2016-05-09 DIAGNOSIS — M25512 Pain in left shoulder: Secondary | ICD-10-CM

## 2016-05-09 DIAGNOSIS — Y999 Unspecified external cause status: Secondary | ICD-10-CM | POA: Insufficient documentation

## 2016-05-09 DIAGNOSIS — S46002S Unspecified injury of muscle(s) and tendon(s) of the rotator cuff of left shoulder, sequela: Secondary | ICD-10-CM | POA: Diagnosis not present

## 2016-05-09 DIAGNOSIS — Y929 Unspecified place or not applicable: Secondary | ICD-10-CM | POA: Insufficient documentation

## 2016-05-09 DIAGNOSIS — X58XXXS Exposure to other specified factors, sequela: Secondary | ICD-10-CM | POA: Diagnosis not present

## 2016-05-09 DIAGNOSIS — Y939 Activity, unspecified: Secondary | ICD-10-CM | POA: Diagnosis not present

## 2016-05-09 MED ORDER — OXYCODONE-ACETAMINOPHEN 5-325 MG PO TABS: 1.0000 | ORAL_TABLET | ORAL | 0 refills | Status: DC | PRN

## 2016-05-09 MED ORDER — KETOROLAC TROMETHAMINE 60 MG/2ML IM SOLN
60.0000 mg | Freq: Once | INTRAMUSCULAR | Status: AC
  Administered 2016-05-09: 60 mg via INTRAMUSCULAR
  Filled 2016-05-09: qty 2

## 2016-05-09 NOTE — ED Triage Notes (Signed)
Patient ambulatory to triage with steady gait, without difficulty or distress noted; pt reports left shoulder injury at work 39mos ago, dx with torn rotator cuff; PT yesterday and now with increased pain

## 2016-05-09 NOTE — ED Notes (Signed)
Pt states he tore his L rotator cuff a few months ago at work, states he has been doing physical therapy, went to PT yesterday. States they were making him do all these different things and says now it hurts worse than it ever has. Pt appears in no distress at this time.

## 2016-05-09 NOTE — ED Provider Notes (Signed)
[facility] Emergency Department Provider Note    First MD Initiated Contact with Patient 05/09/16 0515     (approximate)  I have reviewed the triage vital signs and the nursing notes.   HISTORY  Chief Complaint Shoulder Pain    HPI Andrew Sutton is a 45 y.o. male presents at 8 out of 10 anterior left shoulder pain status post physical therapy yesterday. Patient states he sustained a rotator cuff injury back in June 2017. She is currently receiving physical therapy however he stated after vigorous therapy session yesterday he has had 8 out of 10 anterior shoulder pain as well as movement.   Past Medical History:  Diagnosis Date  . H/O Crohn's disease    diagnosed around age 26, but never follow up    There are no active problems to display for this patient.   Past Surgical History:  Procedure Laterality Date  . APPENDECTOMY    . SHOULDER SURGERY    . WRIST SURGERY      Prior to Admission medications   Medication Sig Start Date End Date Taking? Authorizing Provider  HYDROcodone-acetaminophen (NORCO/VICODIN) 5-325 MG tablet Take 1 tablet by mouth every 4 (four) hours as needed for moderate pain. 01/08/16   Harvest Dark, MD  ibuprofen (ADVIL,MOTRIN) 800 MG tablet Take 1 tablet (800 mg total) by mouth every 8 (eight) hours as needed. 02/10/15   Mortimer Fries, PA-C  methocarbamol (ROBAXIN-750) 750 MG tablet Take 2 tablets (1,500 mg total) by mouth 4 (four) times daily. 02/15/15   Sable Feil, PA-C  naproxen (NAPROSYN) 500 MG tablet Take 1 tablet (500 mg total) by mouth 2 (two) times daily with a meal. 02/27/16   Lavonia Drafts, MD  ondansetron (ZOFRAN) 4 MG tablet Take 1 tablet (4 mg total) by mouth daily as needed for nausea or vomiting. 02/20/16   Lavonia Drafts, MD  oxyCODONE-acetaminophen (ROXICET) 5-325 MG tablet Take 1 tablet by mouth every 4 (four) hours as needed for severe pain. 05/09/16   Gregor Hams, MD    Allergies No known drug  allergies No family history on file.  Social History Social History  Substance Use Topics  . Smoking status: Never Smoker  . Smokeless tobacco: Never Used  . Alcohol use No    Review of Systems Constitutional: No fever/chills Eyes: No visual changes. ENT: No sore throat. Cardiovascular: Denies chest pain. Respiratory: Denies shortness of breath. Gastrointestinal: No abdominal pain.  No nausea, no vomiting.  No diarrhea.  No constipation. Genitourinary: Negative for dysuria. Musculoskeletal: Negative for back pain.Positive for left shoulder pain Skin: Negative for rash. Neurological: Negative for headaches, focal weakness or numbness.  10-point ROS otherwise negative.  ____________________________________________   PHYSICAL EXAM:  VITAL SIGNS: ED Triage Vitals  Enc Vitals Group     BP 05/09/16 0509 135/66     Pulse Rate 05/09/16 0509 77     Resp 05/09/16 0509 18     Temp 05/09/16 0509 98.1 F (36.7 C)     Temp Source 05/09/16 0509 Oral     SpO2 05/09/16 0509 98 %     Weight 05/09/16 0509 180 lb (81.6 kg)     Height 05/09/16 0509 6\' 3"  (1.905 m)     Head Circumference --      Peak Flow --      Pain Score 05/09/16 0508 8     Pain Loc --      Pain Edu? --      Excl.  in [place]? --     Constitutional: Alert and oriented.Apparent discomfort Eyes: Conjunctivae are normal. PERRL. EOMI. Head: Atraumatic. Mouth/Throat: Mucous membranes are moist.  Oropharynx non-erythematous. Neck: No stridor.  No meningeal signs.   Cardiovascular: Normal rate, regular rhythm. Good peripheral circulation. Grossly normal heart sounds Musculoskeletal: No lower extremity tenderness nor edema. Pain to palpation of the anterior left shoulder Neurologic:  Normal speech and language. No gross focal neurologic deficits are appreciated.  Skin:  Skin is warm, dry and intact. No rash noted.    Procedures      INITIAL IMPRESSION / ASSESSMENT AND PLAN / ED COURSE  Pertinent labs & imaging  results that were available during my care of the patient were reviewed by me and considered in my medical decision making (see chart for details).  Patient given Toradol 60 mg IM   Clinical Course    ____________________________________________  FINAL CLINICAL IMPRESSION(S) / ED DIAGNOSES  Final diagnoses:  Left shoulder pain  Rotator cuff injury, left, sequela     MEDICATIONS GIVEN DURING THIS VISIT:  Medications  ketorolac (TORADOL) injection 60 mg (60 mg Intramuscular Given 05/09/16 0556)     NEW OUTPATIENT MEDICATIONS STARTED DURING THIS VISIT:  Discharge Medication List as of 05/09/2016  5:53 AM    START taking these medications   Details  oxyCODONE-acetaminophen (ROXICET) 5-325 MG tablet Take 1 tablet by mouth every 4 (four) hours as needed for severe pain., Starting Wed 05/09/2016, Print        Discharge Medication List as of 05/09/2016  5:53 AM      Discharge Medication List as of 05/09/2016  5:53 AM       Note:  This document was prepared using Dragon voice recognition software and may include unintentional dictation errors.    Gregor Hams, MD 05/09/16 [phone]

## 2016-07-11 ENCOUNTER — Emergency Department
Admission: EM | Admit: 2016-07-11 | Discharge: 2016-07-11 | Disposition: A | Discharge status: Home / Self Care | Payer: Self-pay | Attending: Emergency Medicine | Admitting: Emergency Medicine

## 2016-07-11 ENCOUNTER — Encounter: Payer: Self-pay | Admitting: Emergency Medicine

## 2016-07-11 DIAGNOSIS — R109 Unspecified abdominal pain: Secondary | ICD-10-CM | POA: Insufficient documentation

## 2016-07-11 DIAGNOSIS — R112 Nausea with vomiting, unspecified: Secondary | ICD-10-CM | POA: Insufficient documentation

## 2016-07-11 DIAGNOSIS — R1110 Vomiting, unspecified: Secondary | ICD-10-CM

## 2016-07-11 DIAGNOSIS — Z791 Long term (current) use of non-steroidal anti-inflammatories (NSAID): Secondary | ICD-10-CM | POA: Insufficient documentation

## 2016-07-11 DIAGNOSIS — R197 Diarrhea, unspecified: Secondary | ICD-10-CM | POA: Insufficient documentation

## 2016-07-11 LAB — CBC WITH DIFFERENTIAL/PLATELET
Basophils Absolute: 0 10*3/uL (ref 0–0.1)
Basophils Relative: 1 %
Eosinophils Absolute: 0.1 10*3/uL (ref 0–0.7)
Eosinophils Relative: 2 %
HCT: 42.7 % (ref 40.0–52.0)
Hemoglobin: 14.4 g/dL (ref 13.0–18.0)
Lymphocytes Relative: 55 %
Lymphs Abs: 2 10*3/uL (ref 1.0–3.6)
MCH: 30 pg (ref 26.0–34.0)
MCHC: 33.8 g/dL (ref 32.0–36.0)
MCV: 88.6 fL (ref 80.0–100.0)
Monocytes Absolute: 0.3 10*3/uL (ref 0.2–1.0)
Monocytes Relative: 9 %
Neutro Abs: 1.2 10*3/uL — ABNORMAL LOW (ref 1.4–6.5)
Neutrophils Relative %: 33 %
Platelets: 209 10*3/uL (ref 150–440)
RBC: 4.82 MIL/uL (ref 4.40–5.90)
RDW: 13.6 % (ref 11.5–14.5)
WBC: 3.6 10*3/uL — ABNORMAL LOW (ref 3.8–10.6)

## 2016-07-11 LAB — COMPREHENSIVE METABOLIC PANEL
ALT: 14 U/L — ABNORMAL LOW (ref 17–63)
AST: 22 U/L (ref 15–41)
Albumin: 4.1 g/dL (ref 3.5–5.0)
Alkaline Phosphatase: 59 U/L (ref 38–126)
Anion gap: 3 — ABNORMAL LOW (ref 5–15)
BUN: 14 mg/dL (ref 6–20)
CO2: 29 mmol/L (ref 22–32)
Calcium: 8.9 mg/dL (ref 8.9–10.3)
Chloride: 109 mmol/L (ref 101–111)
Creatinine, Ser: 1.24 mg/dL (ref 0.61–1.24)
GFR calc Af Amer: >60 mL/min (ref >60)
GFR calc non Af Amer: >60 mL/min (ref >60)
Glucose, Bld: 93 mg/dL (ref 65–99)
Potassium: 4 mmol/L (ref 3.5–5.1)
Sodium: 141 mmol/L (ref 135–145)
Total Bilirubin: 0.4 mg/dL (ref 0.3–1.2)
Total Protein: 7.3 g/dL (ref 6.5–8.1)

## 2016-07-11 LAB — LIPASE, BLOOD: Lipase: 30 U/L (ref 11–51)

## 2016-07-11 MED ORDER — SODIUM CHLORIDE 0.9 % IV BOLUS (SEPSIS)
1000.0000 mL | Freq: Once | INTRAVENOUS | Status: AC
  Administered 2016-07-11: 1000 mL via INTRAVENOUS

## 2016-07-11 MED ORDER — ONDANSETRON HCL 4 MG/2ML IJ SOLN
4.0000 mg | Freq: Once | INTRAMUSCULAR | Status: AC
  Administered 2016-07-11: 4 mg via INTRAVENOUS
  Filled 2016-07-11: qty 2

## 2016-07-11 MED ORDER — ONDANSETRON HCL 4 MG PO TABS: 4.0000 mg | ORAL_TABLET | Freq: Every day | ORAL | 0 refills | Status: DC | PRN

## 2016-07-11 NOTE — ED Provider Notes (Signed)
[facility] Emergency Department Provider Note  ____________________________________________   First MD Initiated Contact with Patient 07/11/16 0542     (approximate)  I have reviewed the triage vital signs and the nursing notes.   HISTORY  Chief Complaint Emesis   HPI Andrew Sutton is a 45 y.o. male with a history of Crohn's disease who is presenting with nausea vomiting, diarrhea and abdominal pain. He says that he felt nauseous when he went to sleep last night but then woke up this morning and vomited 3 times with 1 episode of diarrhea. He says that he did not look at his diarrhea to see if there was any blood in it. Does not report any blood in his vomitus. Says that he is a 5 out of 10 pain to the right side of his abdomen which is coming and going. Denies any known sick contacts. Denies any fever. Denies any cough or runny nose.Patient says that he smokes per wanted daily. History of an appendectomy.   Past Medical History:  Diagnosis Date  . H/O Crohn's disease    diagnosed around age 46, but never follow up    There are no active problems to display for this patient.   Past Surgical History:  Procedure Laterality Date  . APPENDECTOMY    . SHOULDER SURGERY    . WRIST SURGERY      Prior to Admission medications   Medication Sig Start Date End Date Taking? Authorizing Provider  HYDROcodone-acetaminophen (NORCO/VICODIN) 5-325 MG tablet Take 1 tablet by mouth every 4 (four) hours as needed for moderate pain. 01/08/16   Harvest Dark, MD  ibuprofen (ADVIL,MOTRIN) 800 MG tablet Take 1 tablet (800 mg total) by mouth every 8 (eight) hours as needed. 02/10/15   Mortimer Fries, PA-C  methocarbamol (ROBAXIN-750) 750 MG tablet Take 2 tablets (1,500 mg total) by mouth 4 (four) times daily. 02/15/15   Sable Feil, PA-C  naproxen (NAPROSYN) 500 MG tablet Take 1 tablet (500 mg total) by mouth 2 (two) times daily with a meal. 02/27/16   Lavonia Drafts, MD    ondansetron (ZOFRAN) 4 MG tablet Take 1 tablet (4 mg total) by mouth daily as needed for nausea or vomiting. 02/20/16   Lavonia Drafts, MD  oxyCODONE-acetaminophen (ROXICET) 5-325 MG tablet Take 1 tablet by mouth every 4 (four) hours as needed for severe pain. 05/09/16   Gregor Hams, MD    Allergies Patient has no known allergies.  No family history on file.  Social History Social History  Substance Use Topics  . Smoking status: Never Smoker  . Smokeless tobacco: Never Used  . Alcohol use No    Review of Systems Constitutional: No fever/chills Eyes: No visual changes. ENT: No sore throat. Cardiovascular: Denies chest pain. Respiratory: Denies shortness of breath. Gastrointestinal:   No constipation. Genitourinary: Negative for dysuria. Musculoskeletal: Negative for back pain. Skin: Negative for rash. Neurological: Negative for headaches, focal weakness or numbness.  10-point ROS otherwise negative.  ____________________________________________   PHYSICAL EXAM:  VITAL SIGNS: ED Triage Vitals  Enc Vitals Group     BP 07/11/16 0541 123/87     Pulse Rate 07/11/16 0541 68     Resp 07/11/16 0541 18     Temp 07/11/16 0541 98 F (36.7 C)     Temp Source 07/11/16 0541 Oral     SpO2 07/11/16 0541 100 %     Weight 07/11/16 0541 176 lb (79.8 kg)     Height 07/11/16  0541 6' 3"  (1.905 m)     Head Circumference --      Peak Flow --      Pain Score 07/11/16 0540 7     Pain Loc --      Pain Edu? --      Excl. in [place]? --     Constitutional: Alert and oriented. Well appearing and in no acute distress. Eyes: Conjunctivae are normal. PERRL. EOMI. Head: Atraumatic. Nose: No congestion/rhinnorhea. Mouth/Throat: Mucous membranes are moist.  Neck: No stridor.   Cardiovascular: Normal rate, regular rhythm. Grossly normal heart sounds.  Respiratory: Normal respiratory effort.  No retractions. Lungs CTAB. Gastrointestinal: Soft and nontender. No distention.  Musculoskeletal:  No lower extremity tenderness nor edema.  No joint effusions. Neurologic:  Normal speech and language. No gross focal neurologic deficits are appreciated. No gait instability. Skin:  Skin is warm, dry and intact. No rash noted. Psychiatric: Mood and affect are normal. Speech and behavior are normal.  ____________________________________________   LABS (all labs ordered are listed, but only abnormal results are displayed)  Labs Reviewed  CBC WITH DIFFERENTIAL/PLATELET - Abnormal; Notable for the following:       Result Value   WBC 3.6 (*)    Neutro Abs 1.2 (*)    All other components within normal limits  COMPREHENSIVE METABOLIC PANEL - Abnormal; Notable for the following:    ALT 14 (*)    Anion gap 3 (*)    All other components within normal limits  LIPASE, BLOOD   ____________________________________________  EKG   ____________________________________________  RADIOLOGY   ____________________________________________   PROCEDURES  Procedure(s) performed:   Procedures  Critical Care performed:   ____________________________________________   INITIAL IMPRESSION / ASSESSMENT AND PLAN / ED COURSE  Pertinent labs & imaging results that were available during my care of the patient were reviewed by me and considered in my medical decision making (see chart for details).   Clinical Course   ----------------------------------------- 6:45 AM on 07/11/2016 -----------------------------------------  Patient resting comfortably at this time. Tolerating crackers as well as fluids. Denies any nausea or pain at this time. I reexamined his abdomen and is soft and nontender. Suspect likely viral cause. Patient says that he is a history of Crohn's but has not seen anybody since being diagnosed in his 44s for follow-up. No history of bowel resection. No history of bowel obstruction. I will give him Zofran by mouth for symptomatic relief at home and also the phone numbers for the  [facility] clinic in the GI on-call schedule follow-up appointments. He is understanding of the plan and willing to comply. He will return to the emergency department for any worsening or concerning symptoms. No signs of obstruction at this time. Suspect likely viral cause.   ____________________________________________   FINAL CLINICAL IMPRESSION(S) / ED DIAGNOSES  Abdominal pain with nausea vomiting and diarrhea.    NEW MEDICATIONS STARTED DURING THIS VISIT:  New Prescriptions   No medications on file     Note:  This document was prepared using Dragon voice recognition software and may include unintentional dictation errors.    Orbie Pyo, MD 07/11/16 [phone]

## 2016-07-11 NOTE — ED Triage Notes (Signed)
Patient ambulatory to triage with steady gait, without difficulty or distress noted, mask in place; pt reports V x 3 upon awakening

## 2016-08-28 ENCOUNTER — Encounter: Payer: Self-pay | Admitting: Gastroenterology

## 2016-08-28 ENCOUNTER — Other Ambulatory Visit: Payer: Self-pay

## 2016-08-28 ENCOUNTER — Ambulatory Visit (INDEPENDENT_AMBULATORY_CARE_PROVIDER_SITE_OTHER): Payer: 59 | Admitting: Gastroenterology

## 2016-08-28 VITALS — BP 110/65 | HR 72 | Temp 98.4°F | Ht 75.0 in | Wt 175.0 lb

## 2016-08-28 DIAGNOSIS — R197 Diarrhea, unspecified: Secondary | ICD-10-CM | POA: Diagnosis not present

## 2016-08-28 DIAGNOSIS — R1031 Right lower quadrant pain: Secondary | ICD-10-CM | POA: Diagnosis not present

## 2016-08-28 DIAGNOSIS — R112 Nausea with vomiting, unspecified: Secondary | ICD-10-CM | POA: Diagnosis not present

## 2016-08-28 DIAGNOSIS — G8929 Other chronic pain: Secondary | ICD-10-CM

## 2016-08-28 NOTE — Progress Notes (Signed)
Gastroenterology Consultation  Referring Provider:     Orbie Pyo, MD Primary Care Physician:  No PCP Per Patient Primary Gastroenterologist:  Dr. Allen Norris     Reason for Consultation:     Possible Crohn's disease        HPI:   Andrew Sutton is a 46 y.o. y/o male referred for consultation & management of Possible Crohn's disease by Dr. Rayne Du PCP Per Patient.  As patient comes today with a history of nausea vomiting and abdominal pain. The patient reports that he had his appendix out when he was a teenager and was told that he has Crohn's disease. The patient denies ever getting treatment for this. He also had a CT scan in 2016 that did not show any signs of Crohn's disease. The patient states that he has episodes of diarrhea with abdominal pain that usually lasts approximately 3 days. The patient had been in the emergency room back in November for nausea vomiting and diarrhea with abdominal pain. The patient had vomited 3 times with 1 episode of diarrhea. At that time was reporting the pain to be a 5 out of 10 and on the right side. The patient states that his pain is typically on the right side. The patient also reports that he has lost approximately 30 pounds in the last few months. He is no report of any blood in his vomitus or in his stool. He has never had an upper endoscopy or colonoscopy. The patient reports that his abdominal pain is in the right lower quadrant when he has the pain.  Past Medical History:  Diagnosis Date  . H/O Crohn's disease    diagnosed around age 35, but never follow up    Past Surgical History:  Procedure Laterality Date  . APPENDECTOMY    . SHOULDER SURGERY    . WRIST SURGERY      Prior to Admission medications   Medication Sig Start Date End Date Taking? Authorizing Provider  HYDROcodone-acetaminophen (NORCO/VICODIN) 5-325 MG tablet Take 1 tablet by mouth every 4 (four) hours as needed for moderate pain. Patient not taking: Reported on 08/28/2016  01/08/16   Harvest Dark, MD  ibuprofen (ADVIL,MOTRIN) 800 MG tablet Take 1 tablet (800 mg total) by mouth every 8 (eight) hours as needed. Patient not taking: Reported on 08/28/2016 02/10/15   Mortimer Fries, PA-C  methocarbamol (ROBAXIN-750) 750 MG tablet Take 2 tablets (1,500 mg total) by mouth 4 (four) times daily. Patient not taking: Reported on 08/28/2016 02/15/15   Sable Feil, PA-C  naproxen (NAPROSYN) 500 MG tablet Take 1 tablet (500 mg total) by mouth 2 (two) times daily with a meal. Patient not taking: Reported on 08/28/2016 02/27/16   Lavonia Drafts, MD  ondansetron (ZOFRAN) 4 MG tablet Take 1 tablet (4 mg total) by mouth daily as needed. Patient not taking: Reported on 08/28/2016 07/11/16   Orbie Pyo, MD  oxyCODONE-acetaminophen (ROXICET) 5-325 MG tablet Take 1 tablet by mouth every 4 (four) hours as needed for severe pain. Patient not taking: Reported on 08/28/2016 05/09/16   Gregor Hams, MD    History reviewed. No pertinent family history.   Social History  Substance Use Topics  . Smoking status: Never Smoker  . Smokeless tobacco: Never Used  . Alcohol use No    Allergies as of 08/28/2016  . (No Known Allergies)    Review of Systems:    All systems reviewed and negative except where noted in HPI.  Physical Exam:  BP 110/65   Pulse 72   Temp 98.4 F (36.9 C) (Oral)   Ht 6\' 3"  (1.905 m)   Wt 175 lb (79.4 kg)   BMI 21.87 kg/m  No LMP for male patient. Psych:  Alert and cooperative. Normal mood and affect. General:   Alert,  Well-developed, well-nourished, pleasant and cooperative in NAD Head:  Normocephalic and atraumatic. Eyes:  Sclera clear, no icterus.   Conjunctiva pink. Ears:  Normal auditory acuity. Nose:  No deformity, discharge, or lesions. Mouth:  No deformity or lesions,oropharynx pink & moist. Neck:  Supple; no masses or thyromegaly. Lungs:  Respirations even and unlabored.  Clear throughout to auscultation.   No wheezes, crackles, or  rhonchi. No acute distress. Heart:  Regular rate and rhythm; no murmurs, clicks, rubs, or gallops. Abdomen:  Normal bowel sounds.  No bruits.  Soft, non-tender and non-distended without masses, hepatosplenomegaly or hernias noted.  No guarding or rebound tenderness.  Negative Carnett sign.   Rectal:  Deferred.  Msk:  Symmetrical without gross deformities.  Good, equal movement & strength bilaterally. Pulses:  Normal pulses noted. Extremities:  No clubbing or edema.  No cyanosis. Neurologic:  Alert and oriented x3;  grossly normal neurologically. Skin:  Intact without significant lesions or rashes.  No jaundice. Lymph Nodes:  No significant cervical adenopathy. Psych:  Alert and cooperative. Normal mood and affect.  Imaging Studies: No results found.  Assessment and Plan:   Andrew Sutton is a 46 y.o. y/o male who comes in with a report of Crohn's disease since he was a teenager from the pathology of his appendix. The patient is never had a workup for Crohn's disease nor has he had biopsies proving that he has Crohn's disease. The patient presents typically with nausea vomiting and diarrhea without any blood as his symptoms for when he feels that he is having a flare of Crohn's disease. The patient will be set up for an EGD and colonoscopy. I have discussed risks & benefits which include, but are not limited to, bleeding, infection, perforation & drug reaction.  The patient agrees with this plan & written consent will be obtained.       Lucilla Lame, MD. Marval Regal   Note: This dictation was prepared with Dragon dictation along with smaller phrase technology. Any transcriptional errors that result from this process are unintentional.

## 2016-08-29 ENCOUNTER — Other Ambulatory Visit: Payer: Self-pay

## 2016-08-30 ENCOUNTER — Encounter: Payer: Self-pay | Admitting: *Deleted

## 2016-08-31 ENCOUNTER — Encounter: Payer: Self-pay | Admitting: Anesthesiology

## 2016-09-05 NOTE — Discharge Instructions (Signed)

## 2016-09-06 ENCOUNTER — Ambulatory Visit: Admission: RE | Admit: 2016-09-06 | Payer: 59 | Source: Ambulatory Visit | Admitting: Gastroenterology

## 2016-09-06 HISTORY — DX: Gastro-esophageal reflux disease without esophagitis: K21.9

## 2016-09-06 HISTORY — DX: Cardiac murmur, unspecified: R01.1

## 2016-09-06 SURGERY — COLONOSCOPY WITH PROPOFOL
Anesthesia: Choice

## 2016-09-07 ENCOUNTER — Telehealth: Payer: Self-pay

## 2016-09-07 NOTE — Telephone Encounter (Signed)
Authorization was received from Marshall Cork. at [facility]. Josem Kaufmann number is [id number] September 05 2016 - December 03 2016

## 2016-09-17 ENCOUNTER — Encounter: Payer: Self-pay | Admitting: Emergency Medicine

## 2016-09-17 ENCOUNTER — Emergency Department
Admission: EM | Admit: 2016-09-17 | Discharge: 2016-09-17 | Disposition: A | Discharge status: Home / Self Care | Payer: 59 | Attending: Emergency Medicine | Admitting: Emergency Medicine

## 2016-09-17 DIAGNOSIS — M7711 Lateral epicondylitis, right elbow: Secondary | ICD-10-CM | POA: Diagnosis not present

## 2016-09-17 DIAGNOSIS — M25521 Pain in right elbow: Secondary | ICD-10-CM | POA: Diagnosis present
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
## 2016-09-17 NOTE — ED Notes (Signed)
Pt alert and oriented X4, active, cooperative, pt in NAD. RR even and unlabored, color WNL.  Pt informed to return if any life threatening symptoms occur.   
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
## 2016-10-29 ENCOUNTER — Encounter: Payer: Self-pay | Admitting: Emergency Medicine

## 2016-10-29 ENCOUNTER — Emergency Department
Admission: EM | Admit: 2016-10-29 | Discharge: 2016-10-29 | Disposition: A | Discharge status: Home / Self Care | Payer: 59 | Attending: Emergency Medicine | Admitting: Emergency Medicine

## 2016-10-29 DIAGNOSIS — Y93G3 Activity, cooking and baking: Secondary | ICD-10-CM | POA: Diagnosis not present

## 2016-10-29 DIAGNOSIS — T300 Burn of unspecified body region, unspecified degree: Secondary | ICD-10-CM

## 2016-10-29 DIAGNOSIS — T23262A Burn of second degree of back of left hand, initial encounter: Secondary | ICD-10-CM | POA: Diagnosis not present

## 2016-10-29 DIAGNOSIS — Y929 Unspecified place or not applicable: Secondary | ICD-10-CM | POA: Insufficient documentation

## 2016-10-29 DIAGNOSIS — S6992XA Unspecified injury of left wrist, hand and finger(s), initial encounter: Secondary | ICD-10-CM | POA: Diagnosis present

## 2016-10-29 DIAGNOSIS — X102XXA Contact with fats and cooking oils, initial encounter: Secondary | ICD-10-CM | POA: Insufficient documentation

## 2016-10-29 DIAGNOSIS — Y999 Unspecified external cause status: Secondary | ICD-10-CM | POA: Diagnosis not present

## 2016-10-29 MED ORDER — BACITRACIN ZINC 500 UNIT/GM EX OINT
TOPICAL_OINTMENT | CUTANEOUS | Status: AC
  Filled 2016-10-29: qty 0.9

## 2016-10-29 MED ORDER — XEROFORM PETROLATUM DRESSING EX PADS: 2.0000 | MEDICATED_PAD | Freq: Two times a day (BID) | CUTANEOUS | 0 refills | Status: AC

## 2016-10-29 MED ORDER — BACITRACIN ZINC 500 UNIT/GM EX OINT: TOPICAL_OINTMENT | Freq: Two times a day (BID) | CUTANEOUS | Status: DC

## 2016-10-29 MED ORDER — SILVER SULFADIAZINE 1 % EX CREA: TOPICAL_CREAM | CUTANEOUS | 1 refills | Status: AC

## 2016-10-29 MED ORDER — BACITRACIN ZINC 500 UNIT/GM EX OINT: TOPICAL_OINTMENT | CUTANEOUS | 0 refills | Status: DC

## 2016-10-29 MED ORDER — XEROFORM PETROLATUM DRESSING EX PADS: 2.0000 | MEDICATED_PAD | Freq: Two times a day (BID) | CUTANEOUS | 0 refills | Status: DC
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
## 2017-03-18 ENCOUNTER — Encounter: Payer: Self-pay | Admitting: Emergency Medicine

## 2017-03-18 DIAGNOSIS — R112 Nausea with vomiting, unspecified: Secondary | ICD-10-CM | POA: Diagnosis present

## 2017-03-18 DIAGNOSIS — R197 Diarrhea, unspecified: Secondary | ICD-10-CM | POA: Diagnosis not present

## 2017-03-18 DIAGNOSIS — E860 Dehydration: Secondary | ICD-10-CM | POA: Insufficient documentation

## 2017-03-18 DIAGNOSIS — M6282 Rhabdomyolysis: Secondary | ICD-10-CM | POA: Diagnosis not present

## 2017-03-18 LAB — COMPREHENSIVE METABOLIC PANEL
ALT: 16 U/L — ABNORMAL LOW (ref 17–63)
AST: 28 U/L (ref 15–41)
Albumin: 3.9 g/dL (ref 3.5–5.0)
Alkaline Phosphatase: 63 U/L (ref 38–126)
Anion gap: 5 (ref 5–15)
BUN: 16 mg/dL (ref 6–20)
CO2: 32 mmol/L (ref 22–32)
Calcium: 9.3 mg/dL (ref 8.9–10.3)
Chloride: 105 mmol/L (ref 101–111)
Creatinine, Ser: 1.44 mg/dL — ABNORMAL HIGH (ref 0.61–1.24)
GFR calc Af Amer: >60 mL/min (ref >60)
GFR calc non Af Amer: 58 mL/min — ABNORMAL LOW (ref >60)
Glucose, Bld: 97 mg/dL (ref 65–99)
Potassium: 4.2 mmol/L (ref 3.5–5.1)
Sodium: 142 mmol/L (ref 135–145)
Total Bilirubin: 0.5 mg/dL (ref 0.3–1.2)
Total Protein: 6.8 g/dL (ref 6.5–8.1)

## 2017-03-18 LAB — CBC
HCT: 40.3 % (ref 40.0–52.0)
Hemoglobin: 13.7 g/dL (ref 13.0–18.0)
MCH: 29.6 pg (ref 26.0–34.0)
MCHC: 34.1 g/dL (ref 32.0–36.0)
MCV: 86.6 fL (ref 80.0–100.0)
Platelets: 201 K/uL (ref 150–440)
RBC: 4.65 MIL/uL (ref 4.40–5.90)
RDW: 13.9 % (ref 11.5–14.5)
WBC: 3.6 K/uL — ABNORMAL LOW (ref 3.8–10.6)

## 2017-03-18 LAB — LIPASE, BLOOD: Lipase: 33 U/L (ref 11–51)

## 2017-03-18 NOTE — ED Triage Notes (Addendum)
Patient ambulatory to triage with steady gait, without difficulty or distress noted; pt reports N/V/D since yesterday; denies any abd pain; reports generalized HA as well

## 2017-03-19 ENCOUNTER — Emergency Department
Admission: EM | Admit: 2017-03-19 | Discharge: 2017-03-19 | Disposition: A | Discharge status: Home / Self Care | Payer: Medicaid Other | Attending: Emergency Medicine | Admitting: Emergency Medicine

## 2017-03-19 DIAGNOSIS — M6282 Rhabdomyolysis: Secondary | ICD-10-CM

## 2017-03-19 DIAGNOSIS — R197 Diarrhea, unspecified: Secondary | ICD-10-CM

## 2017-03-19 DIAGNOSIS — E860 Dehydration: Secondary | ICD-10-CM

## 2017-03-19 DIAGNOSIS — R112 Nausea with vomiting, unspecified: Secondary | ICD-10-CM

## 2017-03-19 LAB — URINALYSIS, COMPLETE (UACMP) WITH MICROSCOPIC
Bacteria, UA: NONE SEEN
Bilirubin Urine: NEGATIVE
Glucose, UA: NEGATIVE mg/dL
Hgb urine dipstick: NEGATIVE
Ketones, ur: NEGATIVE mg/dL
Leukocytes, UA: NEGATIVE
Nitrite: NEGATIVE
Protein, ur: NEGATIVE mg/dL
Specific Gravity, Urine: 1.02 (ref 1.005–1.030)
Squamous Epithelial / LPF: NONE SEEN
pH: 7 (ref 5.0–8.0)

## 2017-03-19 LAB — CK: Total CK: 661 U/L — ABNORMAL HIGH (ref 49–397)

## 2017-03-19 MED ORDER — SODIUM CHLORIDE 0.9 % IV BOLUS (SEPSIS)
1000.0000 mL | Freq: Once | INTRAVENOUS | Status: AC
Start: 2017-03-19 — End: 2017-03-19
  Administered 2017-03-19: 1000 mL via INTRAVENOUS

## 2017-03-19 MED ORDER — ONDANSETRON 4 MG PO TBDP: 4.0000 mg | ORAL_TABLET | Freq: Three times a day (TID) | ORAL | 0 refills | Status: DC | PRN

## 2017-03-19 MED ORDER — ONDANSETRON HCL 4 MG/2ML IJ SOLN
4.0000 mg | Freq: Once | INTRAMUSCULAR | Status: AC
  Administered 2017-03-19: 4 mg via INTRAVENOUS
  Filled 2017-03-19: qty 2
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
## 2017-03-19 NOTE — Discharge Instructions (Signed)
1. You may take Zofran as needed for nausea/vomiting. 2. Drink plenty of fluids daily. 3. Clear liquids 12 hours, then BRAT diet 2 days, then slowly advance diet as tolerated.  4. Return to the ER for worsening symptoms, persistent vomiting, difficulty breathing or other concerns.

## 2017-03-25 ENCOUNTER — Encounter: Payer: Self-pay | Admitting: *Deleted

## 2017-03-25 ENCOUNTER — Emergency Department: Payer: Medicaid Other

## 2017-03-25 ENCOUNTER — Emergency Department
Admission: EM | Admit: 2017-03-25 | Discharge: 2017-03-25 | Disposition: A | Discharge status: Home / Self Care | Payer: Medicaid Other | Attending: Emergency Medicine | Admitting: Emergency Medicine

## 2017-03-25 DIAGNOSIS — R197 Diarrhea, unspecified: Secondary | ICD-10-CM | POA: Insufficient documentation

## 2017-03-25 DIAGNOSIS — R109 Unspecified abdominal pain: Secondary | ICD-10-CM | POA: Insufficient documentation

## 2017-03-25 DIAGNOSIS — E860 Dehydration: Secondary | ICD-10-CM | POA: Diagnosis not present

## 2017-03-25 DIAGNOSIS — R51 Headache: Secondary | ICD-10-CM | POA: Diagnosis not present

## 2017-03-25 DIAGNOSIS — Z8719 Personal history of other diseases of the digestive system: Secondary | ICD-10-CM | POA: Insufficient documentation

## 2017-03-25 DIAGNOSIS — R1110 Vomiting, unspecified: Secondary | ICD-10-CM | POA: Diagnosis present

## 2017-03-25 LAB — URINALYSIS, COMPLETE (UACMP) WITH MICROSCOPIC
Bacteria, UA: NONE SEEN
Bilirubin Urine: NEGATIVE
Glucose, UA: NEGATIVE mg/dL
Hgb urine dipstick: NEGATIVE
Ketones, ur: 20 mg/dL — AB
Leukocytes, UA: NEGATIVE
Nitrite: NEGATIVE
Protein, ur: 30 mg/dL — AB
Specific Gravity, Urine: 1.03 (ref 1.005–1.030)
pH: 5 (ref 5.0–8.0)

## 2017-03-25 LAB — COMPREHENSIVE METABOLIC PANEL
ALT: 14 U/L — ABNORMAL LOW (ref 17–63)
AST: 19 U/L (ref 15–41)
Albumin: 5 g/dL (ref 3.5–5.0)
Alkaline Phosphatase: 66 U/L (ref 38–126)
Anion gap: 9 (ref 5–15)
BUN: 13 mg/dL (ref 6–20)
CO2: 25 mmol/L (ref 22–32)
Calcium: 9.6 mg/dL (ref 8.9–10.3)
Chloride: 107 mmol/L (ref 101–111)
Creatinine, Ser: 1.35 mg/dL — ABNORMAL HIGH (ref 0.61–1.24)
GFR calc Af Amer: >60 mL/min (ref >60)
GFR calc non Af Amer: >60 mL/min (ref >60)
Glucose, Bld: 98 mg/dL (ref 65–99)
Potassium: 3.9 mmol/L (ref 3.5–5.1)
Sodium: 141 mmol/L (ref 135–145)
Total Bilirubin: 0.6 mg/dL (ref 0.3–1.2)
Total Protein: 7.6 g/dL (ref 6.5–8.1)

## 2017-03-25 LAB — CBC
HCT: 42 % (ref 40.0–52.0)
Hemoglobin: 14 g/dL (ref 13.0–18.0)
MCH: 29.5 pg (ref 26.0–34.0)
MCHC: 33.5 g/dL (ref 32.0–36.0)
MCV: 88.1 fL (ref 80.0–100.0)
Platelets: 207 10*3/uL (ref 150–440)
RBC: 4.76 MIL/uL (ref 4.40–5.90)
RDW: 13.8 % (ref 11.5–14.5)
WBC: 4.2 10*3/uL (ref 3.8–10.6)

## 2017-03-25 LAB — LIPASE, BLOOD: Lipase: 25 U/L (ref 11–51)

## 2017-03-25 LAB — CK: Total CK: 149 U/L (ref 49–397)

## 2017-03-25 MED ORDER — IOPAMIDOL (ISOVUE-300) INJECTION 61%
100.0000 mL | Freq: Once | INTRAVENOUS | Status: AC | PRN
  Administered 2017-03-25: 100 mL via INTRAVENOUS

## 2017-03-25 MED ORDER — ACETAMINOPHEN 325 MG PO TABS
650.0000 mg | ORAL_TABLET | Freq: Once | ORAL | Status: AC
  Administered 2017-03-25: 650 mg via ORAL
  Filled 2017-03-25: qty 2

## 2017-03-25 MED ORDER — IOPAMIDOL (ISOVUE-300) INJECTION 61%
30.0000 mL | Freq: Once | INTRAVENOUS | Status: AC | PRN
  Administered 2017-03-25: 30 mL via ORAL

## 2017-03-25 MED ORDER — SODIUM CHLORIDE 0.9 % IV BOLUS (SEPSIS)
1000.0000 mL | Freq: Once | INTRAVENOUS | Status: AC
  Administered 2017-03-25: 1000 mL via INTRAVENOUS

## 2017-03-25 NOTE — Discharge Instructions (Signed)
Please increase the amount of water you are drinking. Your dehydration is better today than it was at your last visit. Follow up with the primary care provider or gastroenterologist. Read over the discharge instructions for foods that make diarrhea worse and avoid them. Take tylenol for your headache. Return to the ER for symptoms that change or worsen if you are unable to schedule an appointment.
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
## 2017-03-25 NOTE — ED Triage Notes (Signed)
Pt to triage via wheelchair.  Pt reports vomiting x 4 yesterday. Diarrhea x 1.  Pt also reports a headache.  Pt alert.  Speech clear.  Pt seen in er last week with similar sx.

## 2017-03-25 NOTE — ED Notes (Signed)
Patient transported to CT 

## 2017-07-11 ENCOUNTER — Encounter: Payer: Self-pay | Admitting: Emergency Medicine

## 2017-07-11 ENCOUNTER — Emergency Department: Payer: No Typology Code available for payment source

## 2017-07-11 ENCOUNTER — Emergency Department
Admission: EM | Admit: 2017-07-11 | Discharge: 2017-07-11 | Disposition: A | Discharge status: Home / Self Care | Payer: No Typology Code available for payment source | Attending: Emergency Medicine | Admitting: Emergency Medicine

## 2017-07-11 DIAGNOSIS — Y92410 Unspecified street and highway as the place of occurrence of the external cause: Secondary | ICD-10-CM | POA: Insufficient documentation

## 2017-07-11 DIAGNOSIS — M62830 Muscle spasm of back: Secondary | ICD-10-CM | POA: Diagnosis not present

## 2017-07-11 DIAGNOSIS — S39012A Strain of muscle, fascia and tendon of lower back, initial encounter: Secondary | ICD-10-CM | POA: Insufficient documentation

## 2017-07-11 DIAGNOSIS — Y939 Activity, unspecified: Secondary | ICD-10-CM | POA: Diagnosis not present

## 2017-07-11 DIAGNOSIS — Y999 Unspecified external cause status: Secondary | ICD-10-CM | POA: Insufficient documentation

## 2017-07-11 DIAGNOSIS — S3992XA Unspecified injury of lower back, initial encounter: Secondary | ICD-10-CM | POA: Diagnosis present

## 2017-07-11 MED ORDER — KETOROLAC TROMETHAMINE 60 MG/2ML IM SOLN
30.0000 mg | Freq: Once | INTRAMUSCULAR | Status: AC
  Administered 2017-07-11: 30 mg via INTRAMUSCULAR
  Filled 2017-07-11: qty 2

## 2017-07-11 MED ORDER — KETOROLAC TROMETHAMINE 10 MG PO TABS: 10.0000 mg | ORAL_TABLET | Freq: Four times a day (QID) | ORAL | 0 refills | Status: AC | PRN

## 2017-07-11 MED ORDER — CYCLOBENZAPRINE HCL 5 MG PO TABS: 5.0000 mg | ORAL_TABLET | Freq: Three times a day (TID) | ORAL | 0 refills | Status: DC | PRN
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
## 2017-07-11 NOTE — Discharge Instructions (Signed)
Take medication as prescribed.   You may also use cold or heat therapy to reduce symptoms as needed.  Return to emergency department if symptoms worsen and follow-up with PCP as needed.
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
## 2018-04-13 ENCOUNTER — Emergency Department
Admission: EM | Admit: 2018-04-13 | Discharge: 2018-04-13 | Disposition: A | Discharge status: Home / Self Care | Payer: No Typology Code available for payment source | Attending: Emergency Medicine | Admitting: Emergency Medicine

## 2018-04-13 ENCOUNTER — Other Ambulatory Visit: Payer: Self-pay

## 2018-04-13 ENCOUNTER — Emergency Department: Payer: No Typology Code available for payment source

## 2018-04-13 DIAGNOSIS — Y939 Activity, unspecified: Secondary | ICD-10-CM | POA: Diagnosis not present

## 2018-04-13 DIAGNOSIS — Y999 Unspecified external cause status: Secondary | ICD-10-CM | POA: Diagnosis not present

## 2018-04-13 DIAGNOSIS — S80812A Abrasion, left lower leg, initial encounter: Secondary | ICD-10-CM | POA: Insufficient documentation

## 2018-04-13 DIAGNOSIS — Y92410 Unspecified street and highway as the place of occurrence of the external cause: Secondary | ICD-10-CM | POA: Diagnosis not present

## 2018-04-13 DIAGNOSIS — S52611A Displaced fracture of right ulna styloid process, initial encounter for closed fracture: Secondary | ICD-10-CM | POA: Insufficient documentation

## 2018-04-13 DIAGNOSIS — T07XXXA Unspecified multiple injuries, initial encounter: Secondary | ICD-10-CM

## 2018-04-13 DIAGNOSIS — S59911A Unspecified injury of right forearm, initial encounter: Secondary | ICD-10-CM | POA: Diagnosis present

## 2018-04-13 DIAGNOSIS — S50312A Abrasion of left elbow, initial encounter: Secondary | ICD-10-CM | POA: Diagnosis not present

## 2018-04-13 DIAGNOSIS — Z79899 Other long term (current) drug therapy: Secondary | ICD-10-CM | POA: Insufficient documentation

## 2018-04-13 MED ORDER — TRAMADOL HCL 50 MG PO TABS
50.0000 mg | ORAL_TABLET | Freq: Once | ORAL | Status: AC
  Administered 2018-04-13: 50 mg via ORAL
  Filled 2018-04-13: qty 1

## 2018-04-13 MED ORDER — BACITRACIN ZINC 500 UNIT/GM EX OINT
1.0000 "application " | TOPICAL_OINTMENT | Freq: Once | CUTANEOUS | Status: AC
  Administered 2018-04-13: 1 via TOPICAL
  Filled 2018-04-13: qty 0.9

## 2018-04-13 MED ORDER — HYDROCODONE-ACETAMINOPHEN 5-325 MG PO TABS: 1.0000 | ORAL_TABLET | Freq: Four times a day (QID) | ORAL | 0 refills | Status: DC | PRN
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
## 2018-04-13 NOTE — Discharge Instructions (Addendum)
Follow-up with Dr. Mack Guise.  Please call him for an appointment.  Take pain medication as needed.  Also take ibuprofen for pain.  Elevate and ice the wrist to help with pain.  Wash the abrasions with soap and water daily.  Apply Neosporin or Vaseline to them daily.
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
## 2018-07-04 ENCOUNTER — Emergency Department
Admission: EM | Admit: 2018-07-04 | Discharge: 2018-07-04 | Disposition: A | Discharge status: Home / Self Care | Payer: Self-pay | Attending: Emergency Medicine | Admitting: Emergency Medicine

## 2018-07-04 ENCOUNTER — Encounter: Payer: Self-pay | Admitting: Emergency Medicine

## 2018-07-04 ENCOUNTER — Other Ambulatory Visit: Payer: Self-pay

## 2018-07-04 DIAGNOSIS — M25532 Pain in left wrist: Secondary | ICD-10-CM

## 2018-07-04 DIAGNOSIS — G5602 Carpal tunnel syndrome, left upper limb: Secondary | ICD-10-CM | POA: Insufficient documentation

## 2018-07-04 MED ORDER — HYDROCODONE-ACETAMINOPHEN 5-325 MG PO TABS: 1.0000 | ORAL_TABLET | Freq: Four times a day (QID) | ORAL | 0 refills | Status: DC | PRN

## 2018-07-04 MED ORDER — IBUPROFEN 800 MG PO TABS
800.0000 mg | ORAL_TABLET | Freq: Once | ORAL | Status: AC
  Administered 2018-07-04: 800 mg via ORAL
  Filled 2018-07-04: qty 1

## 2018-07-04 MED ORDER — IBUPROFEN 800 MG PO TABS: 800.0000 mg | ORAL_TABLET | Freq: Three times a day (TID) | ORAL | 0 refills | Status: DC | PRN

## 2018-07-04 NOTE — Discharge Instructions (Addendum)
You may take pain medicines as needed (Motrin/Norco #15). Wear wrist splint as needed for comfort. Return to the ER for worsening symptoms, persistent vomiting, difficulty breathing or other concerns.

## 2018-07-04 NOTE — ED Notes (Signed)
Pt verbalizes understanding of d/c instructions, medications and follow up 

## 2018-07-04 NOTE — ED Notes (Signed)
Pt reports pain to left wrist that started yesterday. Pt reports was putting his shirt on when it started. Pt denies obvious injuries recently but reports did break that same wrist earlier in the year. No swelling or deformities noted. Pt reports when he grips something or makes a fist it hurts worse.
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
## 2018-07-04 NOTE — ED Triage Notes (Signed)
Patient ambulatory to triage with steady gait, without difficulty or distress noted; pt reports left wrist pain this morning with no recent known injury but has hx fx

## 2018-09-02 ENCOUNTER — Other Ambulatory Visit: Payer: Self-pay

## 2018-09-02 ENCOUNTER — Emergency Department: Payer: Self-pay

## 2018-09-02 ENCOUNTER — Emergency Department
Admission: EM | Admit: 2018-09-02 | Discharge: 2018-09-02 | Disposition: A | Discharge status: Home / Self Care | Payer: Self-pay | Attending: Emergency Medicine | Admitting: Emergency Medicine

## 2018-09-02 ENCOUNTER — Encounter: Payer: Self-pay | Admitting: Emergency Medicine

## 2018-09-02 DIAGNOSIS — Z79899 Other long term (current) drug therapy: Secondary | ICD-10-CM | POA: Insufficient documentation

## 2018-09-02 DIAGNOSIS — Z8679 Personal history of other diseases of the circulatory system: Secondary | ICD-10-CM | POA: Insufficient documentation

## 2018-09-02 DIAGNOSIS — J209 Acute bronchitis, unspecified: Secondary | ICD-10-CM | POA: Insufficient documentation

## 2018-09-02 DIAGNOSIS — Z79891 Long term (current) use of opiate analgesic: Secondary | ICD-10-CM | POA: Insufficient documentation

## 2018-09-02 LAB — BASIC METABOLIC PANEL
Anion gap: 8 (ref 5–15)
BUN: 15 mg/dL (ref 6–20)
CO2: 26 mmol/L (ref 22–32)
Calcium: 9.2 mg/dL (ref 8.9–10.3)
Chloride: 107 mmol/L (ref 98–111)
Creatinine, Ser: 1.36 mg/dL — ABNORMAL HIGH (ref 0.61–1.24)
GFR calc Af Amer: >60 mL/min (ref >60)
GFR calc non Af Amer: >60 mL/min (ref >60)
Glucose, Bld: 98 mg/dL (ref 70–99)
Potassium: 3.6 mmol/L (ref 3.5–5.1)
Sodium: 141 mmol/L (ref 135–145)

## 2018-09-02 LAB — CBC
HCT: 44 % (ref 39.0–52.0)
Hemoglobin: 14.5 g/dL (ref 13.0–17.0)
MCH: 28.6 pg (ref 26.0–34.0)
MCHC: 33 g/dL (ref 30.0–36.0)
MCV: 86.8 fL (ref 80.0–100.0)
Platelets: 213 10*3/uL (ref 150–400)
RBC: 5.07 MIL/uL (ref 4.22–5.81)
RDW: 12.9 % (ref 11.5–15.5)
WBC: 6 10*3/uL (ref 4.0–10.5)
nRBC: 0 % (ref 0.0–0.2)

## 2018-09-02 LAB — TROPONIN I: Troponin I: <0.03 ng/mL (ref <0.03)

## 2018-09-02 MED ORDER — AZITHROMYCIN 250 MG PO TABS: ORAL_TABLET | ORAL | 0 refills | Status: AC

## 2018-09-02 MED ORDER — BENZONATATE 100 MG PO CAPS: 100.0000 mg | ORAL_CAPSULE | Freq: Three times a day (TID) | ORAL | 0 refills | Status: DC | PRN

## 2018-09-02 MED ORDER — BENZONATATE 100 MG PO CAPS
100.0000 mg | ORAL_CAPSULE | Freq: Once | ORAL | Status: AC
  Administered 2018-09-02: 100 mg via ORAL
  Filled 2018-09-02 (×2): qty 1

## 2018-09-02 MED ORDER — AZITHROMYCIN 500 MG PO TABS
500.0000 mg | ORAL_TABLET | Freq: Once | ORAL | Status: AC
  Administered 2018-09-02: 500 mg via ORAL
  Filled 2018-09-02: qty 1
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
## 2018-09-02 NOTE — ED Triage Notes (Addendum)
Pt presents to ED with bilateral side pain since monday. Pt states he has been since with congestion and cough for the past few days. Pain radiates from just below breast to bottom of ribcage. Pain does not increase with palpation. Pain increases with movement. Pt states it is constant. Pt currently has no increased work of breathing noted at this time.

## 2018-09-02 NOTE — ED Notes (Signed)
Pharmacy notified for [facility].  Will d/c pt once it arrives

## 2018-09-12 ENCOUNTER — Emergency Department
Admission: EM | Admit: 2018-09-12 | Discharge: 2018-09-12 | Disposition: A | Discharge status: Home / Self Care | Payer: Medicaid Other | Attending: Emergency Medicine | Admitting: Emergency Medicine

## 2018-09-12 ENCOUNTER — Other Ambulatory Visit: Payer: Self-pay

## 2018-09-12 ENCOUNTER — Encounter: Payer: Self-pay | Admitting: Emergency Medicine

## 2018-09-12 ENCOUNTER — Emergency Department: Payer: Medicaid Other

## 2018-09-12 DIAGNOSIS — W109XXA Fall (on) (from) unspecified stairs and steps, initial encounter: Secondary | ICD-10-CM | POA: Insufficient documentation

## 2018-09-12 DIAGNOSIS — Y998 Other external cause status: Secondary | ICD-10-CM | POA: Insufficient documentation

## 2018-09-12 DIAGNOSIS — Y9389 Activity, other specified: Secondary | ICD-10-CM | POA: Insufficient documentation

## 2018-09-12 DIAGNOSIS — S93602A Unspecified sprain of left foot, initial encounter: Secondary | ICD-10-CM

## 2018-09-12 DIAGNOSIS — Y92009 Unspecified place in unspecified non-institutional (private) residence as the place of occurrence of the external cause: Secondary | ICD-10-CM | POA: Insufficient documentation

## 2018-09-12 MED ORDER — TRAMADOL HCL 50 MG PO TABS: 50.0000 mg | ORAL_TABLET | Freq: Four times a day (QID) | ORAL | 0 refills | Status: DC | PRN

## 2018-09-12 MED ORDER — NAPROXEN 500 MG PO TABS: 500.0000 mg | ORAL_TABLET | Freq: Two times a day (BID) | ORAL | 0 refills | Status: DC

## 2018-09-12 NOTE — Discharge Instructions (Signed)
Follow up with podiatry if not improving over the week.  Return to the ER for symptoms that change or worsen if unable to schedule an appointment with PCP or the specialist.

## 2018-09-12 NOTE — ED Notes (Signed)
Pt reports that today he started with left foot pain - denies injury - no deformity noted

## 2018-09-12 NOTE — ED Triage Notes (Signed)
Pt arrived with complaints of sudden onset of left foot pain after moving the wrong way. No obvious deformity
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
## 2019-05-22 ENCOUNTER — Emergency Department: Payer: BC Managed Care – PPO

## 2019-05-22 ENCOUNTER — Emergency Department
Admission: EM | Admit: 2019-05-22 | Discharge: 2019-05-22 | Disposition: A | Discharge status: Home / Self Care | Payer: BC Managed Care – PPO | Attending: Emergency Medicine | Admitting: Emergency Medicine

## 2019-05-22 ENCOUNTER — Other Ambulatory Visit: Payer: Self-pay

## 2019-05-22 DIAGNOSIS — R112 Nausea with vomiting, unspecified: Secondary | ICD-10-CM | POA: Diagnosis not present

## 2019-05-22 DIAGNOSIS — R1030 Lower abdominal pain, unspecified: Secondary | ICD-10-CM | POA: Diagnosis not present

## 2019-05-22 DIAGNOSIS — Z20828 Contact with and (suspected) exposure to other viral communicable diseases: Secondary | ICD-10-CM | POA: Insufficient documentation

## 2019-05-22 DIAGNOSIS — Z79899 Other long term (current) drug therapy: Secondary | ICD-10-CM | POA: Insufficient documentation

## 2019-05-22 LAB — COMPREHENSIVE METABOLIC PANEL
ALT: 14 U/L (ref 0–44)
AST: 21 U/L (ref 15–41)
Albumin: 4 g/dL (ref 3.5–5.0)
Alkaline Phosphatase: 62 U/L (ref 38–126)
Anion gap: 7 (ref 5–15)
BUN: 13 mg/dL (ref 6–20)
CO2: 28 mmol/L (ref 22–32)
Calcium: 9.1 mg/dL (ref 8.9–10.3)
Chloride: 107 mmol/L (ref 98–111)
Creatinine, Ser: 1.22 mg/dL (ref 0.61–1.24)
GFR calc Af Amer: >60 mL/min (ref >60)
GFR calc non Af Amer: >60 mL/min (ref >60)
Glucose, Bld: 95 mg/dL (ref 70–99)
Potassium: 4.1 mmol/L (ref 3.5–5.1)
Sodium: 142 mmol/L (ref 135–145)
Total Bilirubin: 0.7 mg/dL (ref 0.3–1.2)
Total Protein: 6.8 g/dL (ref 6.5–8.1)

## 2019-05-22 LAB — SARS CORONAVIRUS 2 BY RT PCR (HOSPITAL ORDER, PERFORMED IN ~~LOC~~ HOSPITAL LAB): SARS Coronavirus 2: NEGATIVE

## 2019-05-22 LAB — CBC WITH DIFFERENTIAL/PLATELET
Abs Immature Granulocytes: 0.02 10*3/uL (ref 0.00–0.07)
Basophils Absolute: 0 10*3/uL (ref 0.0–0.1)
Basophils Relative: 0 %
Eosinophils Absolute: 0.2 10*3/uL (ref 0.0–0.5)
Eosinophils Relative: 2 %
HCT: 41.5 % (ref 39.0–52.0)
Hemoglobin: 13.7 g/dL (ref 13.0–17.0)
Immature Granulocytes: 0 %
Lymphocytes Relative: 31 %
Lymphs Abs: 2.1 10*3/uL (ref 0.7–4.0)
MCH: 28.6 pg (ref 26.0–34.0)
MCHC: 33 g/dL (ref 30.0–36.0)
MCV: 86.6 fL (ref 80.0–100.0)
Monocytes Absolute: 0.7 10*3/uL (ref 0.1–1.0)
Monocytes Relative: 10 %
Neutro Abs: 3.9 10*3/uL (ref 1.7–7.7)
Neutrophils Relative %: 57 %
Platelets: 209 10*3/uL (ref 150–400)
RBC: 4.79 MIL/uL (ref 4.22–5.81)
RDW: 12.8 % (ref 11.5–15.5)
WBC: 7 10*3/uL (ref 4.0–10.5)
nRBC: 0 % (ref 0.0–0.2)

## 2019-05-22 LAB — URINALYSIS, COMPLETE (UACMP) WITH MICROSCOPIC
Bacteria, UA: NONE SEEN
Bilirubin Urine: NEGATIVE
Glucose, UA: NEGATIVE mg/dL
Hgb urine dipstick: NEGATIVE
Ketones, ur: NEGATIVE mg/dL
Leukocytes,Ua: NEGATIVE
Nitrite: NEGATIVE
Protein, ur: NEGATIVE mg/dL
Specific Gravity, Urine: 1.018 (ref 1.005–1.030)
pH: 5 (ref 5.0–8.0)

## 2019-05-22 LAB — LIPASE, BLOOD: Lipase: 33 U/L (ref 11–51)

## 2019-05-22 MED ORDER — DOXYCYCLINE HYCLATE 100 MG PO TBEC: 100.0000 mg | DELAYED_RELEASE_TABLET | Freq: Two times a day (BID) | ORAL | 0 refills | Status: DC

## 2019-05-22 MED ORDER — OXYCODONE-ACETAMINOPHEN 5-325 MG PO TABS: 1.0000 | ORAL_TABLET | ORAL | 0 refills | Status: DC | PRN

## 2019-05-22 MED ORDER — HYDROMORPHONE HCL 1 MG/ML IJ SOLN
1.0000 mg | Freq: Once | INTRAMUSCULAR | Status: AC
  Administered 2019-05-22: 1 mg via INTRAVENOUS
  Filled 2019-05-22: qty 1

## 2019-05-22 MED ORDER — MORPHINE SULFATE (PF) 2 MG/ML IV SOLN
2.0000 mg | Freq: Once | INTRAVENOUS | Status: AC
  Administered 2019-05-22: 10:00:00 2 mg via INTRAVENOUS
  Filled 2019-05-22: qty 1

## 2019-05-22 MED ORDER — SORBITOL 70 % SOLN
960.0000 mL | TOPICAL_OIL | Freq: Once | ORAL | Status: DC
  Filled 2019-05-22: qty 473

## 2019-05-22 MED ORDER — SODIUM CHLORIDE 0.9 % IV BOLUS
1000.0000 mL | Freq: Once | INTRAVENOUS | Status: AC
  Administered 2019-05-22: 1000 mL via INTRAVENOUS

## 2019-05-22 MED ORDER — IOHEXOL 300 MG/ML  SOLN
100.0000 mL | Freq: Once | INTRAMUSCULAR | Status: AC | PRN
  Administered 2019-05-22: 08:00:00 100 mL via INTRAVENOUS

## 2019-05-22 MED ORDER — ONDANSETRON HCL 4 MG/2ML IJ SOLN
4.0000 mg | Freq: Once | INTRAMUSCULAR | Status: AC
  Administered 2019-05-22: 4 mg via INTRAVENOUS
  Filled 2019-05-22: qty 2

## 2019-05-22 MED ORDER — DOXYCYCLINE HYCLATE 100 MG PO TABS
100.0000 mg | ORAL_TABLET | Freq: Once | ORAL | Status: AC
  Administered 2019-05-22: 10:00:00 100 mg via ORAL
  Filled 2019-05-22: qty 1

## 2019-05-22 MED ORDER — IOHEXOL 9 MG/ML PO SOLN
500.0000 mL | ORAL | Status: AC
  Administered 2019-05-22 (×2): 500 mL via ORAL

## 2019-05-22 NOTE — ED Notes (Signed)
This RN to bedside, pt returned from CT. Pt is alert and oriented, watching TV at this time. Pt assisted to the bathroom by this RN. Pt c/o increased pain to groin with movement. Pt able to urinate without difficulty.

## 2019-05-22 NOTE — ED Notes (Signed)
This RN to bedside at this time. Pt states "I feel 100% better". Pt denies any pain at this time. Pt states understanding of starting his 2nd bottle of contrast at 0750. Pt denies further needs at this time. Will continue to monitor for further patient needs.

## 2019-05-22 NOTE — ED Notes (Signed)
Pt assisted to restroom.  Called CT and informed Vivien Rota that he is done with oral contrast.

## 2019-05-22 NOTE — ED Notes (Signed)
NAD noted at time of D/C. Pt taken to lobby via wheelchair by this RN. Pt denies comments/concerns regarding D/C instructions at this time.

## 2019-05-22 NOTE — ED Provider Notes (Signed)
[name] CT shows with some possible inflammation in the second or third part of the duodenum.  There is no other inflammation elsewhere.  There is not a lot of stool anywhere below the sigmoid.  [name] not having any pain in his abdomen or scrotum or penis unless he moves.  Then it hurts.  He reports he pulled a white tick which was very small off his inner thigh 4 days ago.  The pain started somewhat after this.  Rectal exam shows normally patent rectum with no stool in the vault.  My finger was Hemoccult negative afterwards.  I was going to give him an enema but I will not as I do not believe that we will do anything if it is stool is above the sigmoid.  I will give him some doxycycline in case this is related to the tick bite and a little bit of pain medicine have him follow-up with GI for his Crohn's return if he has any further problems.   Nena Polio, MD 05/22/19 1007

## 2019-05-22 NOTE — ED Notes (Signed)
Pt instructed to call his wife to pick him due to patient receiving morphine at 0955. Pt calling his wife to pick him up at this time.
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
## 2019-05-22 NOTE — Discharge Instructions (Signed)
Please use the Percocet 1 pill 4 times a day if needed for pain.  Take the doxycycline 1 pill twice a day to see if this helps for the pain as it may be related to the tick bite.  Please follow-up with Dr. Marius Ditch the gastroenterologist or see Dr. Allen Norris for follow-up for your Crohn's.  Please return here for worse pain fever vomiting or feeling sicker.
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
## 2019-05-22 NOTE — ED Notes (Signed)
Pt reports last few days having lower abd pain radiating into lower back and perineum; st V x 1 this am with "pencil thin" stool; denies hx of same; +BS, abd soft/nondist/nontender

## 2019-05-25 ENCOUNTER — Telehealth: Payer: Self-pay | Admitting: Gastroenterology

## 2019-05-25 NOTE — Telephone Encounter (Signed)
Unable to leave msg to offer apt with Dr. Marius Ditch

## 2019-06-02 ENCOUNTER — Encounter: Payer: Self-pay | Admitting: Gastroenterology

## 2019-07-14 ENCOUNTER — Other Ambulatory Visit: Payer: Self-pay

## 2019-07-14 ENCOUNTER — Encounter (INDEPENDENT_AMBULATORY_CARE_PROVIDER_SITE_OTHER): Payer: Self-pay

## 2019-07-14 ENCOUNTER — Encounter: Payer: Self-pay | Admitting: Gastroenterology

## 2019-07-14 ENCOUNTER — Ambulatory Visit: Payer: BC Managed Care – PPO | Admitting: Gastroenterology

## 2019-07-14 VITALS — BP 107/73 | HR 67 | Temp 98.5°F | Resp 16 | Ht 75.0 in | Wt 178.0 lb

## 2019-07-14 DIAGNOSIS — K5000 Crohn's disease of small intestine without complications: Secondary | ICD-10-CM

## 2019-07-14 DIAGNOSIS — K639 Disease of intestine, unspecified: Secondary | ICD-10-CM
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
## 2019-07-16 ENCOUNTER — Other Ambulatory Visit: Payer: Self-pay

## 2019-07-16 ENCOUNTER — Encounter: Payer: Self-pay | Admitting: *Deleted

## 2019-07-17 ENCOUNTER — Other Ambulatory Visit
Admission: RE | Admit: 2019-07-17 | Discharge: 2019-07-17 | Disposition: A | Discharge status: Home / Self Care | Payer: BC Managed Care – PPO | Source: Ambulatory Visit | Attending: Gastroenterology | Admitting: Gastroenterology

## 2019-07-17 DIAGNOSIS — Z01812 Encounter for preprocedural laboratory examination: Secondary | ICD-10-CM | POA: Diagnosis present

## 2019-07-17 DIAGNOSIS — Z20828 Contact with and (suspected) exposure to other viral communicable diseases: Secondary | ICD-10-CM | POA: Diagnosis not present

## 2019-07-18 LAB — SARS CORONAVIRUS 2 (TAT 6-24 HRS): SARS Coronavirus 2: NEGATIVE

## 2019-07-21 NOTE — Discharge Instructions (Signed)

## 2019-07-22 ENCOUNTER — Ambulatory Visit: Payer: BC Managed Care – PPO | Admitting: Anesthesiology

## 2019-07-22 ENCOUNTER — Encounter
Admission: RE | Disposition: A | Discharge status: Home / Self Care | Payer: Self-pay | Source: Home / Self Care | Attending: Gastroenterology

## 2019-07-22 ENCOUNTER — Ambulatory Visit
Admission: RE | Admit: 2019-07-22 | Discharge: 2019-07-22 | Disposition: A | Discharge status: Home / Self Care | Payer: BC Managed Care – PPO | Attending: Gastroenterology | Admitting: Gastroenterology

## 2019-07-22 DIAGNOSIS — K635 Polyp of colon: Secondary | ICD-10-CM | POA: Diagnosis not present

## 2019-07-22 DIAGNOSIS — R1013 Epigastric pain: Secondary | ICD-10-CM

## 2019-07-22 DIAGNOSIS — R933 Abnormal findings on diagnostic imaging of other parts of digestive tract: Secondary | ICD-10-CM | POA: Diagnosis present

## 2019-07-22 DIAGNOSIS — B9681 Helicobacter pylori [H. pylori] as the cause of diseases classified elsewhere: Secondary | ICD-10-CM | POA: Diagnosis not present

## 2019-07-22 DIAGNOSIS — K3189 Other diseases of stomach and duodenum: Secondary | ICD-10-CM | POA: Diagnosis not present

## 2019-07-22 DIAGNOSIS — K2950 Unspecified chronic gastritis without bleeding: Secondary | ICD-10-CM | POA: Insufficient documentation

## 2019-07-22 DIAGNOSIS — K5090 Crohn's disease, unspecified, without complications: Secondary | ICD-10-CM | POA: Diagnosis not present

## 2019-07-22 DIAGNOSIS — K2980 Duodenitis without bleeding: Secondary | ICD-10-CM | POA: Diagnosis not present

## 2019-07-22 DIAGNOSIS — D120 Benign neoplasm of cecum: Secondary | ICD-10-CM | POA: Insufficient documentation

## 2019-07-22 DIAGNOSIS — Z8719 Personal history of other diseases of the digestive system: Secondary | ICD-10-CM | POA: Diagnosis not present

## 2019-07-22 HISTORY — PX: POLYPECTOMY: SHX5525

## 2019-07-22 HISTORY — DX: Unspecified osteoarthritis, unspecified site: M19.90

## 2019-07-22 HISTORY — PX: COLONOSCOPY WITH PROPOFOL: SHX5780

## 2019-07-22 HISTORY — PX: ESOPHAGOGASTRODUODENOSCOPY (EGD) WITH PROPOFOL: SHX5813

## 2019-07-22 SURGERY — ESOPHAGOGASTRODUODENOSCOPY (EGD) WITH PROPOFOL
Anesthesia: General | Site: Throat

## 2019-07-22 MED ORDER — ACETAMINOPHEN 160 MG/5ML PO SOLN: 325.0000 mg | ORAL | Status: DC | PRN

## 2019-07-22 MED ORDER — LACTATED RINGERS IV SOLN
INTRAVENOUS | Status: DC
  Administered 2019-07-22: 09:00:00 via INTRAVENOUS

## 2019-07-22 MED ORDER — ONDANSETRON HCL 4 MG/2ML IJ SOLN: 4.0000 mg | Freq: Once | INTRAMUSCULAR | Status: DC | PRN

## 2019-07-22 MED ORDER — STERILE WATER FOR IRRIGATION IR SOLN
Status: DC | PRN
  Administered 2019-07-22: .05 mL

## 2019-07-22 MED ORDER — LIDOCAINE HCL (CARDIAC) PF 100 MG/5ML IV SOSY
PREFILLED_SYRINGE | INTRAVENOUS | Status: DC | PRN
  Administered 2019-07-22: 40 mg via INTRAVENOUS

## 2019-07-22 MED ORDER — ACETAMINOPHEN 325 MG PO TABS: 325.0000 mg | ORAL_TABLET | ORAL | Status: DC | PRN

## 2019-07-22 MED ORDER — PROPOFOL 10 MG/ML IV BOLUS
INTRAVENOUS | Status: DC | PRN
  Administered 2019-07-22: 30 mg via INTRAVENOUS
  Administered 2019-07-22 (×2): 20 mg via INTRAVENOUS
  Administered 2019-07-22: 70 mg via INTRAVENOUS
  Administered 2019-07-22: 30 mg via INTRAVENOUS
  Administered 2019-07-22: 50 mg via INTRAVENOUS
  Administered 2019-07-22: 30 mg via INTRAVENOUS
  Administered 2019-07-22: 50 mg via INTRAVENOUS

## 2019-07-22 MED ORDER — GLYCOPYRROLATE 0.2 MG/ML IJ SOLN
INTRAMUSCULAR | Status: DC | PRN
  Administered 2019-07-22 (×2): 0.1 mg via INTRAVENOUS

## 2019-07-22 SURGICAL SUPPLY — 9 items
BLOCK BITE 60FR ADLT L/F GRN (MISCELLANEOUS) ×4 IMPLANT
CANISTER SUCT 1200ML W/VALVE (MISCELLANEOUS) ×4 IMPLANT
FORCEPS BIOP RAD 4 LRG CAP 4 (CUTTING FORCEPS) ×4 IMPLANT
GOWN CVR UNV OPN BCK APRN NK (MISCELLANEOUS) ×6 IMPLANT
GOWN ISOL THUMB LOOP REG UNIV (MISCELLANEOUS) ×2
KIT ENDO PROCEDURE OLY (KITS) ×4 IMPLANT
SNARE COLD EXACTO (MISCELLANEOUS) ×4 IMPLANT
TRAP ETRAP POLY (MISCELLANEOUS) ×4 IMPLANT
WATER STERILE IRR 250ML POUR (IV SOLUTION) ×4 IMPLANT

## 2019-07-22 NOTE — Anesthesia Preprocedure Evaluation (Addendum)
Anesthesia Evaluation  Patient identified by MRN, date of birth, ID band Patient awake    Reviewed: Allergy & Precautions, NPO status , [name] Chart, lab work & pertinent test results  History of Anesthesia Complications Negative for: history of anesthetic complications  Airway Mallampati: II  TM Distance: >3 FB Neck ROM: full    Dental no notable dental hx.    Pulmonary neg pulmonary ROS,  + THC   Pulmonary exam normal        Cardiovascular Exercise Tolerance: Good Normal cardiovascular exam  'extra heart beat'  Ekg: jan 2020: SR   Neuro/Psych negative neurological ROS  negative psych ROS   GI/Hepatic Neg liver ROS, GERD  ,Crohn's disease   Endo/Other  negative endocrine ROS  Renal/GU negative Renal ROS  negative genitourinary   Musculoskeletal negative musculoskeletal ROS (+) Arthritis ,   Abdominal   Peds negative pediatric ROS (+)  Hematology negative hematology ROS (+)   Anesthesia Other Findings Covid: NEG  Reproductive/Obstetrics negative OB ROS                            Anesthesia Physical Anesthesia Plan  ASA: II  Anesthesia Plan: General   Post-op Pain Management:    Induction:   PONV Risk Score and Plan: 2 and TIVA and Propofol infusion  Airway Management Planned:   Additional Equipment:   Intra-op Plan:   Post-operative Plan:   Informed Consent: I have reviewed the patients History and Physical, chart, labs and discussed the procedure including the risks, benefits and alternatives for the proposed anesthesia with the patient or authorized representative who has indicated his/her understanding and acceptance.       Plan Discussed with: CRNA  Anesthesia Plan Comments:         Anesthesia Quick Evaluation

## 2019-07-22 NOTE — Transfer of Care (Signed)
Immediate Anesthesia Transfer of Care Note  Patient: Andrew Sutton  Procedure(s) Performed: ESOPHAGOGASTRODUODENOSCOPY (EGD) WITH PROPOFOL (N/A Throat) COLONOSCOPY WITH PROPOFOL (N/A Rectum) POLYPECTOMY (Rectum)  Patient Location: PACU  Anesthesia Type: General  Level of Consciousness: awake, alert  and patient cooperative  Airway and Oxygen Therapy: Patient Spontanous Breathing and Patient connected to supplemental oxygen  Post-op Assessment: Post-op Vital signs reviewed, [name] Cardiovascular Status Stable, Respiratory Function Stable, Patent Airway and No signs of Nausea or vomiting  Post-op Vital Signs: Reviewed and stable  Complications: No apparent anesthesia complications

## 2019-07-22 NOTE — Op Note (Signed)
[facility] Gastroenterology Patient Name: Andrew Sutton Procedure Date: 07/22/2019 9:42 AM MRN: [id number] Account #: [id number] Date of Birth: Sep 20, 1970 Admit Type: Outpatient Age: 48 Room: [facility] OR ROOM 01 Gender: Male Note Status: Finalized Procedure:             Colonoscopy Indications:           Suspected Crohn's disease Providers:             Lin Landsman MD, MD Medicines:             Monitored Anesthesia Care Complications:         No immediate complications. Estimated blood loss: None. Procedure:             Pre-Anesthesia Assessment:                        - Prior to the procedure, a History and Physical was                         performed, and patient medications and allergies were                         reviewed. The patient is competent. The risks and                         benefits of the procedure and the sedation options and                         risks were discussed with the patient. All questions                         were answered and informed consent was obtained.                         Patient identification and proposed procedure were                         verified by the physician, the nurse, the                         anesthesiologist, the anesthetist and the technician                         in the pre-procedure area in the procedure room in the                         endoscopy suite. Mental Status Examination: alert and                         oriented. Airway Examination: normal oropharyngeal                         airway and neck mobility. Respiratory Examination:                         clear to auscultation. CV Examination: normal.                         Prophylactic Antibiotics: The patient does not require  prophylactic antibiotics. Prior Anticoagulants: The                         patient has taken no previous anticoagulant or                         antiplatelet agents. ASA Grade  Assessment: II - A                         patient with mild systemic disease. After reviewing                         the risks and benefits, the patient was deemed in                         satisfactory condition to undergo the procedure. The                         anesthesia plan was to use monitored anesthesia care                         (MAC). Immediately prior to administration of                         medications, the patient was re-assessed for adequacy                         to receive sedatives. The heart rate, respiratory                         rate, oxygen saturations, blood pressure, adequacy of                         pulmonary ventilation, and response to care were                         monitored throughout the procedure. The physical                         status of the patient was re-assessed after the                         procedure.                        After obtaining informed consent, the colonoscope was                         passed under direct vision. Throughout the procedure,                         the patient's blood pressure, pulse, and oxygen                         saturations were monitored continuously. The was                         introduced through the anus and advanced to the the  terminal ileum, with identification of the appendiceal                         orifice and IC valve. The colonoscopy was performed                         without difficulty. The patient tolerated the                         procedure well. The quality of the bowel preparation                         was evaluated using the BBPS [facility] Bowel Preparation                         Scale) with scores of: Right Colon = 3, Transverse                         Colon = 3 and Left Colon = 3 (entire mucosa seen well                         with no residual staining, small fragments of stool or                         opaque liquid). The total BBPS  score equals 9. Findings:      The perianal and digital rectal examinations were normal. Pertinent       negatives include normal sphincter tone and no palpable rectal lesions.      The terminal ileum appeared normal.      A 7 mm polyp was found in the cecum. The polyp was sessile. The polyp       was removed with a cold snare. Resection and retrieval were complete.      Normal mucosa was found in the entire colon.      The retroflexed view of the distal rectum and anal verge was normal and       showed no anal or rectal abnormalities.      The exam was otherwise without abnormality. Impression:            - The examined portion of the ileum was normal.                        - One 7 mm polyp in the cecum, removed with a cold                         snare. Resected and retrieved.                        - Normal mucosa in the entire examined colon.                        - The distal rectum and anal verge are normal on                         retroflexion view.                        - The examination  was otherwise normal. Recommendation:        - Discharge patient to home (with escort).                        - Resume previous diet today.                        - Continue present medications.                        - Await pathology results.                        - Repeat colonoscopy in 7 years for surveillance. Procedure Code(s):     --- Professional ---                        [id number], Colonoscopy, flexible; with removal of                         tumor(s), polyp(s), or other lesion(s) by snare                         technique Diagnosis Code(s):     --- Professional ---                        K63.5, Polyp of colon CPT copyright 2019 American Medical Association. All rights reserved. The codes documented in this report are preliminary and upon coder review may  be revised to meet current compliance requirements. Dr. Ulyess Mort Lin Landsman MD, MD 07/22/2019 10:29:21 AM This  report has been signed electronically. Number of Addenda: 0 Note Initiated On: 07/22/2019 9:42 AM Scope Withdrawal Time: 0 hours 8 minutes 20 seconds  Total Procedure Duration: 0 hours 10 minutes 9 seconds  Estimated Blood Loss:  Estimated blood loss: none.      [facility]

## 2019-07-22 NOTE — Anesthesia Postprocedure Evaluation (Signed)
Anesthesia Post Note  Patient: Andrew Sutton  Procedure(s) Performed: ESOPHAGOGASTRODUODENOSCOPY (EGD) WITH PROPOFOL (N/A Throat) COLONOSCOPY WITH PROPOFOL (N/A Rectum) POLYPECTOMY (Rectum)     Patient location during evaluation: PACU Anesthesia Type: General Level of consciousness: awake and alert Pain management: pain level controlled Vital Signs Assessment: post-procedure vital signs reviewed and stable Respiratory status: spontaneous breathing, nonlabored ventilation, respiratory function stable and patient connected to nasal cannula oxygen Cardiovascular status: blood pressure returned to baseline and stable Postop Assessment: no apparent nausea or vomiting Anesthetic complications: no    [name]
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
## 2019-07-22 NOTE — Op Note (Signed)
[facility] Gastroenterology Patient Name: Andrew Sutton Procedure Date: 07/22/2019 9:46 AM MRN: [id number] Account #: [id number] Date of Birth: 02/21/1971 Admit Type: Outpatient Age: 48 Room: [facility] OR ROOM 01 Gender: Male Note Status: Finalized Procedure:             Upper GI endoscopy Indications:           Abnormal CT of the GI tract, Crohn's disease Providers:             Lin Landsman MD, MD Medicines:             Monitored Anesthesia Care Complications:         No immediate complications. Estimated blood loss: None. Procedure:             Pre-Anesthesia Assessment:                        - Prior to the procedure, a History and Physical was                         performed, and patient medications and allergies were                         reviewed. The patient is competent. The risks and                         benefits of the procedure and the sedation options and                         risks were discussed with the patient. All questions                         were answered and informed consent was obtained.                         Patient identification and proposed procedure were                         verified by the physician, the nurse, the                         anesthesiologist, the anesthetist and the technician                         in the pre-procedure area in the procedure room in the                         endoscopy suite. Mental Status Examination: alert and                         oriented. Airway Examination: normal oropharyngeal                         airway and neck mobility. Respiratory Examination:                         clear to auscultation. CV Examination: normal.                         Prophylactic  Antibiotics: The patient does not require                         prophylactic antibiotics. Prior Anticoagulants: The                         patient has taken no previous anticoagulant or                         antiplatelet  agents. ASA Grade Assessment: II - A                         patient with mild systemic disease. After reviewing                         the risks and benefits, the patient was deemed in                         satisfactory condition to undergo the procedure. The                         anesthesia plan was to use monitored anesthesia care                         (MAC). Immediately prior to administration of                         medications, the patient was re-assessed for adequacy                         to receive sedatives. The heart rate, respiratory                         rate, oxygen saturations, blood pressure, adequacy of                         pulmonary ventilation, and response to care were                         monitored throughout the procedure. The physical                         status of the patient was re-assessed after the                         procedure.                        After obtaining informed consent, the endoscope was                         passed under direct vision. Throughout the procedure,                         the patient's blood pressure, pulse, and oxygen                         saturations were monitored continuously. The Endoscope  was introduced through the mouth, and advanced to the                         second part of duodenum. The upper GI endoscopy was                         accomplished without difficulty. The patient tolerated                         the procedure well. Findings:      The duodenal bulb and second portion of the duodenum were normal.       Biopsies were taken with a cold forceps for histology.      The entire examined stomach was normal. Biopsies were taken with a cold       forceps for histology.      The gastroesophageal junction and examined esophagus were normal. Impression:            - Normal duodenal bulb and second portion of the                         duodenum. Biopsied.                         - Normal stomach. Biopsied.                        - Normal gastroesophageal junction and esophagus. Recommendation:        - Await pathology results.                        - Proceed with colonoscopy as scheduled                        See colonoscopy report Procedure Code(s):     --- Professional ---                        [phone], Esophagogastroduodenoscopy, flexible,                         transoral; with biopsy, single or multiple Diagnosis Code(s):     --- Professional ---                        K50.90, Crohn's disease, unspecified, without                         complications                        R93.3, Abnormal findings on diagnostic imaging of                         other parts of digestive tract CPT copyright 2019 American Medical Association. All rights reserved. The codes documented in this report are preliminary and upon coder review may  be revised to meet current compliance requirements. Dr. Ulyess Mort Lin Landsman MD, MD 07/22/2019 10:15:08 AM This report has been signed electronically. Number of Addenda: 0 Note Initiated On: 07/22/2019 9:46 AM Total Procedure Duration: 0 hours 5 minutes 17 seconds  Estimated Blood Loss:  Estimated blood  loss: none.      [facility]

## 2019-07-27 ENCOUNTER — Telehealth: Payer: Self-pay | Admitting: Gastroenterology

## 2019-07-27 ENCOUNTER — Other Ambulatory Visit: Payer: Self-pay

## 2019-07-27 NOTE — Telephone Encounter (Signed)
Work note completed. Placed upfront for pt to pickup.

## 2019-07-27 NOTE — Telephone Encounter (Signed)
Patient came in an requested a note for 07-17-19 thru 07-22-19. He had his Covid test on 07-17-19 & his procedure was done 07-22-19.Please call when completed. Dr Verlin Grills patient.

## 2019-07-28 ENCOUNTER — Ambulatory Visit: Payer: BC Managed Care – PPO | Admitting: Gastroenterology

## 2019-07-30 ENCOUNTER — Encounter: Payer: Self-pay | Admitting: Gastroenterology

## 2019-08-02 ENCOUNTER — Encounter: Payer: Self-pay | Admitting: Gastroenterology

## 2019-08-03 ENCOUNTER — Telehealth: Payer: Self-pay

## 2019-08-03 DIAGNOSIS — Q400 Congenital hypertrophic pyloric stenosis: Secondary | ICD-10-CM

## 2019-08-03 MED ORDER — AMOXICILLIN 500 MG PO TABS: 1000.0000 mg | ORAL_TABLET | Freq: Two times a day (BID) | ORAL | 0 refills | Status: AC

## 2019-08-03 MED ORDER — OMEPRAZOLE 40 MG PO CPDR: 40.0000 mg | DELAYED_RELEASE_CAPSULE | Freq: Two times a day (BID) | ORAL | 0 refills | Status: DC

## 2019-08-03 MED ORDER — ESOMEPRAZOLE MAGNESIUM 20 MG PO CPDR: 20.0000 mg | DELAYED_RELEASE_CAPSULE | Freq: Two times a day (BID) | ORAL | 0 refills | Status: DC

## 2019-08-03 MED ORDER — CLARITHROMYCIN 500 MG PO TABS: 500.0000 mg | ORAL_TABLET | Freq: Two times a day (BID) | ORAL | 0 refills | Status: AC

## 2019-08-03 NOTE — Telephone Encounter (Signed)
Patient insurance company denied the Omeprazole. Please advised what medication you want him to take

## 2019-08-03 NOTE — Telephone Encounter (Signed)
Sent medication to pharmacy for Nexium 20mg .

## 2019-08-03 NOTE — Telephone Encounter (Signed)
Patient verbalized understanding of results. Sent medication to the pharmacy. Order labs in 6 weeks

## 2019-08-03 NOTE — Addendum Note (Signed)
Addended by: Ulyess Blossom L on: 08/03/2019 11:32 AM   Modules accepted: Orders

## 2019-08-03 NOTE — Telephone Encounter (Signed)
-----   Message from Lin Landsman, MD sent at 08/02/2019 10:52 PM EST ----- Plz send him the prescription for triple therapy to treat H Pylori for 14days  Omeprazole 40mg  BID Clarithromycin 500mg  BID Amoxicillin 1gm BID  Order H Pylori breath test in 4weeks after completing medication to confirm eradication. He should be off prilosec and H2 blocker atleast for 2weeks before the test  Thanks RV

## 2019-08-03 NOTE — Telephone Encounter (Signed)
Let's try esomeprazole or rabeprazole 84m BID  Thanks RV

## 2019-09-10 ENCOUNTER — Other Ambulatory Visit: Payer: Self-pay

## 2019-09-10 ENCOUNTER — Emergency Department: Payer: BC Managed Care – PPO

## 2019-09-10 ENCOUNTER — Emergency Department
Admission: EM | Admit: 2019-09-10 | Discharge: 2019-09-10 | Disposition: A | Discharge status: Home / Self Care | Payer: BC Managed Care – PPO | Attending: Emergency Medicine | Admitting: Emergency Medicine

## 2019-09-10 DIAGNOSIS — M545 Low back pain: Secondary | ICD-10-CM | POA: Insufficient documentation

## 2019-09-10 DIAGNOSIS — F1210 Cannabis abuse, uncomplicated: Secondary | ICD-10-CM | POA: Diagnosis not present

## 2019-09-10 DIAGNOSIS — M542 Cervicalgia: Secondary | ICD-10-CM | POA: Diagnosis not present

## 2019-09-10 MED ORDER — METHOCARBAMOL 500 MG PO TABS: 500.0000 mg | ORAL_TABLET | Freq: Three times a day (TID) | ORAL | 0 refills | Status: AC | PRN

## 2019-09-10 MED ORDER — MELOXICAM 15 MG PO TABS: 15.0000 mg | ORAL_TABLET | Freq: Every day | ORAL | 1 refills | Status: AC
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
## 2019-09-22 ENCOUNTER — Ambulatory Visit: Payer: BC Managed Care – PPO | Admitting: Gastroenterology

## 2019-10-23 ENCOUNTER — Other Ambulatory Visit: Payer: Self-pay

## 2019-10-23 ENCOUNTER — Emergency Department
Admission: EM | Admit: 2019-10-23 | Discharge: 2019-10-23 | Disposition: A | Discharge status: Home / Self Care | Payer: BC Managed Care – PPO | Attending: Emergency Medicine | Admitting: Emergency Medicine

## 2019-10-23 ENCOUNTER — Encounter: Payer: Self-pay | Admitting: Emergency Medicine

## 2019-10-23 ENCOUNTER — Emergency Department: Payer: BC Managed Care – PPO

## 2019-10-23 DIAGNOSIS — M25511 Pain in right shoulder: Secondary | ICD-10-CM | POA: Diagnosis not present

## 2019-10-23 DIAGNOSIS — Z79899 Other long term (current) drug therapy: Secondary | ICD-10-CM | POA: Diagnosis not present

## 2019-10-23 MED ORDER — IBUPROFEN 600 MG PO TABS: 600.0000 mg | ORAL_TABLET | Freq: Four times a day (QID) | ORAL | 0 refills | Status: DC | PRN

## 2019-10-23 MED ORDER — IBUPROFEN 600 MG PO TABS
600.0000 mg | ORAL_TABLET | Freq: Once | ORAL | Status: AC
  Administered 2019-10-23: 600 mg via ORAL
  Filled 2019-10-23: qty 1

## 2019-10-23 NOTE — ED Triage Notes (Signed)
Patient ambulatory to triage with steady gait, without difficulty or distress noted, mask in place; pt reports falling down steps 2 days ago injuring rt shoulder

## 2019-10-23 NOTE — ED Notes (Signed)
Pt back from X-ray.  

## 2019-10-23 NOTE — ED Notes (Signed)
Pt signed hard copy of E-signature and it was placed for medical records pick up

## 2019-10-23 NOTE — ED Notes (Signed)
Pt taken to Xray.
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
## 2019-10-26 ENCOUNTER — Other Ambulatory Visit: Payer: Self-pay

## 2019-10-27 ENCOUNTER — Ambulatory Visit: Payer: BC Managed Care – PPO | Admitting: Gastroenterology

## 2020-01-25 ENCOUNTER — Emergency Department
Admission: EM | Admit: 2020-01-25 | Discharge: 2020-01-25 | Disposition: A | Discharge status: Home / Self Care | Payer: BC Managed Care – PPO | Attending: Student in an Organized Health Care Education/Training Program | Admitting: Student in an Organized Health Care Education/Training Program

## 2020-01-25 ENCOUNTER — Other Ambulatory Visit: Payer: Self-pay

## 2020-01-25 ENCOUNTER — Encounter: Payer: Self-pay | Admitting: Emergency Medicine

## 2020-01-25 DIAGNOSIS — J029 Acute pharyngitis, unspecified: Secondary | ICD-10-CM | POA: Insufficient documentation

## 2020-01-25 DIAGNOSIS — H10012 Acute follicular conjunctivitis, left eye: Secondary | ICD-10-CM | POA: Diagnosis not present

## 2020-01-25 DIAGNOSIS — Z79899 Other long term (current) drug therapy: Secondary | ICD-10-CM | POA: Diagnosis not present

## 2020-01-25 LAB — GROUP A STREP BY PCR: Group A Strep by PCR: NOT DETECTED

## 2020-01-25 MED ORDER — NAPHAZOLINE-PHENIRAMINE 0.025-0.3 % OP SOLN: 1.0000 [drp] | Freq: Four times a day (QID) | OPHTHALMIC | 0 refills | Status: DC | PRN

## 2020-01-25 MED ORDER — HYDROXYZINE HCL 50 MG PO TABS: 50.0000 mg | ORAL_TABLET | Freq: Three times a day (TID) | ORAL | 0 refills | Status: DC | PRN

## 2020-01-25 NOTE — ED Notes (Signed)
Se triage note  Presents with sore throat  States sore throat started yesterday no fever  Also has some swelling to left eye

## 2020-01-25 NOTE — ED Triage Notes (Signed)
Here for sore throat since yesterday. No significant swelling to tonsils or exudate noted.  No fevers.  Also c/o swelling to left upper eye lid and pain to eye.  Blurry vision per pt when eye watering. NAD. VSS.
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
## 2020-01-25 NOTE — Discharge Instructions (Signed)
Follow discharge care instructions and treat both eyes with eyedrops as directed.

## 2020-04-30 ENCOUNTER — Other Ambulatory Visit: Payer: Self-pay

## 2020-04-30 ENCOUNTER — Emergency Department: Payer: BC Managed Care – PPO

## 2020-04-30 DIAGNOSIS — Z5321 Procedure and treatment not carried out due to patient leaving prior to being seen by health care provider: Secondary | ICD-10-CM | POA: Diagnosis not present

## 2020-04-30 DIAGNOSIS — M25512 Pain in left shoulder: Secondary | ICD-10-CM | POA: Diagnosis not present

## 2020-04-30 NOTE — ED Triage Notes (Signed)
Pt arrived via POV with c/o right shoulder pain, denies any fall or injury.

## 2020-05-01 ENCOUNTER — Emergency Department
Admission: EM | Admit: 2020-05-01 | Discharge: 2020-05-01 | Disposition: A | Discharge status: Home / Self Care | Payer: BC Managed Care – PPO | Attending: Emergency Medicine | Admitting: Emergency Medicine

## 2020-07-19 ENCOUNTER — Ambulatory Visit (INDEPENDENT_AMBULATORY_CARE_PROVIDER_SITE_OTHER): Payer: BC Managed Care – PPO | Admitting: Podiatry

## 2020-07-19 ENCOUNTER — Other Ambulatory Visit: Payer: Self-pay

## 2020-07-19 ENCOUNTER — Encounter: Payer: Self-pay | Admitting: Podiatry

## 2020-07-19 ENCOUNTER — Ambulatory Visit (INDEPENDENT_AMBULATORY_CARE_PROVIDER_SITE_OTHER): Payer: BC Managed Care – PPO

## 2020-07-19 DIAGNOSIS — M722 Plantar fascial fibromatosis: Secondary | ICD-10-CM

## 2020-07-19 NOTE — Progress Notes (Signed)
  Subjective:  Patient ID: Andrew Sutton, male    DOB: 01/13/1971,  MRN: [id number]  No chief complaint on file.   49 y.o. male presents with the above complaint. Patient presents with complaint of left heel pain that has been going on for quite some time. Patient states it has been going for 2 to 3 months very painful in the morning and standing is stabbing chest shooting pain in the heels. [name] tried icy and meloxicam none of that has helped. He would like to discuss treatment options for this. He has not been seen anyone else prior to seeing me. He denies any other acute complaints.   Review of Systems: Negative except as noted in the HPI. Denies N/V/F/Ch.  Past Medical History:  Diagnosis Date  . Arthritis    wrists, fingers, left ankle  . GERD (gastroesophageal reflux disease)   . H/O Crohn's disease    diagnosed around age 37, but never follow up  . Heart murmur    followed by PCP    Current Outpatient Medications:  .  latanoprost (XALATAN) 0.005 % ophthalmic solution, , Disp: , Rfl:  .  meloxicam (MOBIC) 15 MG tablet, Take 15 mg by mouth daily., Disp: , Rfl:  .  naphazoline-pheniramine (NAPHCON-A) 0.025-0.3 % ophthalmic solution, Place 1 drop into both eyes 4 (four) times daily as needed for eye irritation., Disp: 15 mL, Rfl: 0 .  ofloxacin (OCUFLOX) 0.3 % ophthalmic solution, , Disp: , Rfl:  .  ondansetron (ZOFRAN) 4 MG tablet, Take 4 mg by mouth every 4 (four) hours as needed., Disp: , Rfl:  .  TALICIA 562-56.3-89 MG CPDR, Take by mouth., Disp: , Rfl:   Social History   Tobacco Use  Smoking Status Never Smoker  Smokeless Tobacco Never Used    No Known Allergies Objective:  There were no vitals filed for this visit. There is no height or weight on file to calculate BMI. Constitutional Well developed. Well nourished.  Vascular Dorsalis pedis pulses palpable bilaterally. Posterior tibial pulses palpable bilaterally. Capillary refill normal to all digits.  No  cyanosis or clubbing noted. Pedal hair growth normal.  Neurologic Normal speech. Oriented to person, place, and time. Epicritic sensation to light touch grossly present bilaterally.  Dermatologic Nails well groomed and normal in appearance. No open wounds. No skin lesions.  Orthopedic: Normal joint ROM without pain or crepitus bilaterally. No visible deformities. Tender to palpation at the calcaneal tuber left. No pain with calcaneal squeeze left. Ankle ROM diminished range of motion left. Silfverskiold Test: positive left.   Radiographs: Taken and reviewed. No acute fractures or dislocations. No evidence of stress fracture.  Plantar heel spur present. Posterior heel spur absent.   Assessment:   1. Plantar fasciitis of left foot    Plan:  Patient was evaluated and treated and all questions answered.  Plantar Fasciitis, left - XR reviewed as above.  - Educated on icing and stretching. Instructions given.  - Injection delivered to the plantar fascia as below. - DME: Plantar Fascial Brace - Pharmacologic management: None  Procedure: Injection Tendon/Ligament Location: Left plantar fascia at the glabrous junction; medial approach. Skin Prep: alcohol Injectate: 0.5 cc 0.5% marcaine plain, 0.5 cc of 1% Lidocaine, 0.5 cc kenalog 10. Disposition: Patient tolerated procedure well. Injection site dressed with a band-aid.  No follow-ups on file.

## 2020-08-16 ENCOUNTER — Ambulatory Visit: Payer: BC Managed Care – PPO | Admitting: Podiatry

## 2020-08-18 ENCOUNTER — Ambulatory Visit (INDEPENDENT_AMBULATORY_CARE_PROVIDER_SITE_OTHER): Payer: BC Managed Care – PPO | Admitting: Podiatry

## 2020-08-18 ENCOUNTER — Other Ambulatory Visit: Payer: Self-pay

## 2020-08-18 ENCOUNTER — Encounter: Payer: Self-pay | Admitting: Podiatry

## 2020-08-18 DIAGNOSIS — M722 Plantar fascial fibromatosis: Secondary | ICD-10-CM

## 2020-08-18 DIAGNOSIS — Q666 Other congenital valgus deformities of feet: Secondary | ICD-10-CM

## 2020-08-22 ENCOUNTER — Encounter: Payer: Self-pay | Admitting: Podiatry

## 2020-08-22 NOTE — Progress Notes (Signed)
Subjective:  Patient ID: Andrew Sutton, male    DOB: [birth year],  MRN: [id number]  Chief Complaint  Patient presents with  . Plantar Fasciitis    "I still have shooting pains at times, but its a lot better.  I can walk on it now"    49 y.o. male presents with the above complaint.  Patient presents with follow-up of left plantar fasciitis.  Patient states he is doing a lot better.  He is able to walk on it.  He is pain is improvement with the steroid shot as well as bracing.  He states that he still has a pretty good amount of pain that seems to get better.  He would like to discuss future treatment options.  He denies any other acute complaints.   Review of Systems: Negative except as noted in the HPI. Denies N/V/F/Ch.  Past Medical History:  Diagnosis Date  . Arthritis    wrists, fingers, left ankle  . GERD (gastroesophageal reflux disease)   . H/O Crohn's disease    diagnosed around age 30, but never follow up  . Heart murmur    followed by PCP    Current Outpatient Medications:  .  latanoprost (XALATAN) 0.005 % ophthalmic solution, , Disp: , Rfl:  .  meloxicam (MOBIC) 15 MG tablet, Take 15 mg by mouth daily., Disp: , Rfl:  .  naphazoline-pheniramine (NAPHCON-A) 0.025-0.3 % ophthalmic solution, Place 1 drop into both eyes 4 (four) times daily as needed for eye irritation., Disp: 15 mL, Rfl: 0 .  ofloxacin (OCUFLOX) 0.3 % ophthalmic solution, , Disp: , Rfl:  .  ondansetron (ZOFRAN) 4 MG tablet, Take 4 mg by mouth every 4 (four) hours as needed., Disp: , Rfl:  .  TALICIA 564-33.2-95 MG CPDR, Take by mouth., Disp: , Rfl:   Social History   Tobacco Use  Smoking Status Never Smoker  Smokeless Tobacco Never Used    No Known Allergies Objective:  There were no vitals filed for this visit. There is no height or weight on file to calculate BMI. Constitutional Well developed. Well nourished.  Vascular Dorsalis pedis pulses palpable bilaterally. Posterior tibial pulses  palpable bilaterally. Capillary refill normal to all digits.  No cyanosis or clubbing noted. Pedal hair growth normal.  Neurologic Normal speech. Oriented to person, place, and time. Epicritic sensation to light touch grossly present bilaterally.  Dermatologic Nails well groomed and normal in appearance. No open wounds. No skin lesions.  Orthopedic: Normal joint ROM without pain or crepitus bilaterally. No visible deformities. Tender to palpation at the calcaneal tuber left. No pain with calcaneal squeeze left. Ankle ROM diminished range of motion left. Silfverskiold Test: positive left.   Radiographs: Taken and reviewed. No acute fractures or dislocations. No evidence of stress fracture.  Plantar heel spur present. Posterior heel spur absent.   Assessment:   1. Pes planovalgus   2. Plantar fasciitis of left foot    Plan:  Patient was evaluated and treated and all questions answered.  Plantar Fasciitis, left - XR reviewed as above.  - Educated on icing and stretching. Instructions given.  -Second injection delivered to the plantar fascia as below. - DME: Night splint x1 - Pharmacologic management: None  Semiflexible/planovalgus - The patient the etiology of pes planovalgus and various treatment options were discussed.  I believe patient will benefit from custom-made orthotics to help control the hindfoot motion and support the arch of the foot as well as take the pressure away from plantar fascia.  Patient agrees with the plan like to obtain orthotics -He will be scheduled see rec for custom-made orthotics  Procedure: Injection Tendon/Ligament Location: Left plantar fascia at the glabrous junction; medial approach. Skin Prep: alcohol Injectate: 0.5 cc 0.5% marcaine plain, 0.5 cc of 1% Lidocaine, 0.5 cc kenalog 10. Disposition: Patient tolerated procedure well. Injection site dressed with a band-aid.  No follow-ups on file.

## 2020-09-02 ENCOUNTER — Other Ambulatory Visit: Payer: BC Managed Care – PPO

## 2020-09-02 DIAGNOSIS — Z20822 Contact with and (suspected) exposure to covid-19: Secondary | ICD-10-CM

## 2020-09-06 LAB — NOVEL CORONAVIRUS, NAA: SARS-CoV-2, NAA: DETECTED — AB

## 2020-09-09 ENCOUNTER — Emergency Department
Admission: EM | Admit: 2020-09-09 | Discharge: 2020-09-09 | Disposition: A | Discharge status: Home / Self Care | Payer: BC Managed Care – PPO | Attending: Emergency Medicine | Admitting: Emergency Medicine

## 2020-09-09 ENCOUNTER — Emergency Department: Payer: BC Managed Care – PPO

## 2020-09-09 ENCOUNTER — Other Ambulatory Visit: Payer: Self-pay

## 2020-09-09 DIAGNOSIS — R0602 Shortness of breath: Secondary | ICD-10-CM | POA: Insufficient documentation

## 2020-09-09 DIAGNOSIS — R0781 Pleurodynia: Secondary | ICD-10-CM | POA: Insufficient documentation

## 2020-09-09 DIAGNOSIS — J00 Acute nasopharyngitis [common cold]: Secondary | ICD-10-CM | POA: Insufficient documentation

## 2020-09-09 DIAGNOSIS — Z5321 Procedure and treatment not carried out due to patient leaving prior to being seen by health care provider: Secondary | ICD-10-CM | POA: Diagnosis not present

## 2020-09-09 LAB — CBC
HCT: 42 % (ref 39.0–52.0)
Hemoglobin: 14 g/dL (ref 13.0–17.0)
MCH: 28.9 pg (ref 26.0–34.0)
MCHC: 33.3 g/dL (ref 30.0–36.0)
MCV: 86.6 fL (ref 80.0–100.0)
Platelets: 280 10*3/uL (ref 150–400)
RBC: 4.85 MIL/uL (ref 4.22–5.81)
RDW: 12 % (ref 11.5–15.5)
WBC: 4.5 10*3/uL (ref 4.0–10.5)
nRBC: 0 % (ref 0.0–0.2)

## 2020-09-09 LAB — BASIC METABOLIC PANEL
Anion gap: 8 (ref 5–15)
BUN: 16 mg/dL (ref 6–20)
CO2: 31 mmol/L (ref 22–32)
Calcium: 9.2 mg/dL (ref 8.9–10.3)
Chloride: 104 mmol/L (ref 98–111)
Creatinine, Ser: 1.23 mg/dL (ref 0.61–1.24)
GFR, Estimated: >60 mL/min (ref >60)
Glucose, Bld: 96 mg/dL (ref 70–99)
Potassium: 3.8 mmol/L (ref 3.5–5.1)
Sodium: 143 mmol/L (ref 135–145)

## 2020-09-09 LAB — TROPONIN I (HIGH SENSITIVITY): Troponin I (High Sensitivity): 3 ng/L (ref <18)

## 2020-09-09 NOTE — ED Notes (Signed)
Pt reports he has to go, Rn encouraged pt to stay pt replies" I have to go"

## 2020-09-09 NOTE — ED Triage Notes (Signed)
Pt states cold sweats and shob. Pt complains of right rib pain. Appears in no acute distress.

## 2020-09-14 ENCOUNTER — Other Ambulatory Visit: Payer: BC Managed Care – PPO | Admitting: Orthotics

## 2020-09-15 ENCOUNTER — Encounter: Payer: Self-pay | Admitting: Podiatry

## 2020-09-15 ENCOUNTER — Ambulatory Visit (INDEPENDENT_AMBULATORY_CARE_PROVIDER_SITE_OTHER): Payer: BC Managed Care – PPO | Admitting: Podiatry

## 2020-09-15 ENCOUNTER — Other Ambulatory Visit: Payer: Self-pay

## 2020-09-15 DIAGNOSIS — M722 Plantar fascial fibromatosis: Secondary | ICD-10-CM | POA: Diagnosis not present

## 2020-09-15 DIAGNOSIS — Q666 Other congenital valgus deformities of feet: Secondary | ICD-10-CM

## 2020-09-16 ENCOUNTER — Encounter: Payer: Self-pay | Admitting: Podiatry

## 2020-09-16 NOTE — Progress Notes (Signed)
Subjective:  Patient ID: Andrew Sutton, male    DOB: 11/19/1970,  MRN: [id number]  Chief Complaint  Patient presents with  . Plantar Fasciitis    "its doing a lot better"    50 y.o. male presents with the above complaint.  Patient presents with a follow-up of left plantar fasciitis.  He is 90% resolved.  It took 2 steroid injection.  He is doing well with the bracing.  He would like to discuss any future final treatment plans.  He denies any other acute complaints.  He does not wear orthotics.  Review of Systems: Negative except as noted in the HPI. Denies N/V/F/Ch.  Past Medical History:  Diagnosis Date  . Arthritis    wrists, fingers, left ankle  . GERD (gastroesophageal reflux disease)   . H/O Crohn's disease    diagnosed around age 76, but never follow up  . Heart murmur    followed by PCP    Current Outpatient Medications:  .  latanoprost (XALATAN) 0.005 % ophthalmic solution, , Disp: , Rfl:  .  meloxicam (MOBIC) 15 MG tablet, Take 15 mg by mouth daily., Disp: , Rfl:  .  naphazoline-pheniramine (NAPHCON-A) 0.025-0.3 % ophthalmic solution, Place 1 drop into both eyes 4 (four) times daily as needed for eye irritation., Disp: 15 mL, Rfl: 0 .  ofloxacin (OCUFLOX) 0.3 % ophthalmic solution, , Disp: , Rfl:  .  ondansetron (ZOFRAN) 4 MG tablet, Take 4 mg by mouth every 4 (four) hours as needed., Disp: , Rfl:  .  TALICIA 093-26.7-12 MG CPDR, Take by mouth., Disp: , Rfl:   Social History   Tobacco Use  Smoking Status Never Smoker  Smokeless Tobacco Never Used    No Known Allergies Objective:  There were no vitals filed for this visit. There is no height or weight on file to calculate BMI. Constitutional Well developed. Well nourished.  Vascular Dorsalis pedis pulses palpable bilaterally. Posterior tibial pulses palpable bilaterally. Capillary refill normal to all digits.  No cyanosis or clubbing noted. Pedal hair growth normal.  Neurologic Normal speech. Oriented to  person, place, and time. Epicritic sensation to light touch grossly present bilaterally.  Dermatologic Nails well groomed and normal in appearance. No open wounds. No skin lesions.  Orthopedic: Normal joint ROM without pain or crepitus bilaterally. No visible deformities. No tender to palpation at the calcaneal tuber left. No pain with calcaneal squeeze left. Ankle ROM diminished range of motion left. Silfverskiold Test: positive left.   Radiographs: Taken and reviewed. No acute fractures or dislocations. No evidence of stress fracture.  Plantar heel spur present. Posterior heel spur absent.   Assessment:   1. Pes planovalgus   2. Plantar fasciitis of left foot    Plan:  Patient was evaluated and treated and all questions answered.  Plantar Fasciitis, left -Clinically healed with steroid injections.  Patient overall is doing much better with bracing.  I instructed him to continue wearing bracing until he can get himself into orthotics.  I discussed shoe gear modification as well with the patient in extensive detail.  He states understanding.  If any foot and ankle issues arise in the future come back and see me.  Semiflexible/planovalgus -The patient the etiology of pes planovalgus and various treatment options were discussed.  I believe patient will benefit from custom-made orthotics to help control the hindfoot motion and support the arch of the foot as well as take the pressure away from plantar fascia.  Patient agrees with the plan  like to obtain orthotics -He will be scheduled see rec for custom-made orthotics   No follow-ups on file.

## 2020-09-28 ENCOUNTER — Other Ambulatory Visit: Payer: Self-pay

## 2020-09-28 ENCOUNTER — Ambulatory Visit (INDEPENDENT_AMBULATORY_CARE_PROVIDER_SITE_OTHER): Payer: BC Managed Care – PPO | Admitting: Orthotics

## 2020-09-28 DIAGNOSIS — M722 Plantar fascial fibromatosis: Secondary | ICD-10-CM | POA: Diagnosis not present

## 2020-09-28 DIAGNOSIS — Q666 Other congenital valgus deformities of feet: Secondary | ICD-10-CM

## 2020-09-28 NOTE — Progress Notes (Signed)
Patient is being seen today for f/o to address congential pes planus/pes planovalgus. Patient is active youth and demonstrates over pronation in gait, prominent medially shifted talus, and collapse of medial column.  Goals are RF stability, longitudinal arch support, decrease in pronation, and ease of discomfort in mobility related activities.

## 2020-10-27 ENCOUNTER — Ambulatory Visit: Payer: BC Managed Care – PPO | Admitting: Podiatry

## 2020-11-15 ENCOUNTER — Encounter: Payer: Self-pay | Admitting: Podiatry

## 2020-11-15 ENCOUNTER — Other Ambulatory Visit: Payer: Self-pay

## 2020-11-15 ENCOUNTER — Ambulatory Visit (INDEPENDENT_AMBULATORY_CARE_PROVIDER_SITE_OTHER): Payer: BC Managed Care – PPO | Admitting: Podiatry

## 2020-11-15 DIAGNOSIS — M7732 Calcaneal spur, left foot: Secondary | ICD-10-CM

## 2020-11-15 DIAGNOSIS — M722 Plantar fascial fibromatosis: Secondary | ICD-10-CM

## 2020-11-15 MED ORDER — MELOXICAM 15 MG PO TABS: 15.0000 mg | ORAL_TABLET | Freq: Every day | ORAL | 2 refills | Status: DC

## 2020-11-15 NOTE — Progress Notes (Signed)
Subjective:  Patient ID: Andrew Sutton, male    DOB: [birth year],  MRN: [id number]  Chief Complaint  Patient presents with  . Plantar Fasciitis  . Callouses    "my left heel is really painful and I have something on the bottom of my right foot.  When I walk it feels like something sticking me"    50 y.o. male presents with the above complaint.  Patient presents for follow-up of recurrence of left plantar fasciitis.  Patient states that it started coming out of nowhere to the left side.  He states that he was doing really well.  He put his orthotics in and then 3 weeks later he started getting the heel pain again.  He never took out the factory insole from the shoe prior to putting the orthotics which may have elevated the heel and through the plantar fascia.  He denies any other acute complaints.  He would like to rediscuss treating it.  Review of Systems: Negative except as noted in the HPI. Denies N/V/F/Ch.  Past Medical History:  Diagnosis Date  . Arthritis    wrists, fingers, left ankle  . GERD (gastroesophageal reflux disease)   . H/O Crohn's disease    diagnosed around age 66, but never follow up  . Heart murmur    followed by PCP    Current Outpatient Medications:  .  meloxicam (MOBIC) 15 MG tablet, Take 1 tablet (15 mg total) by mouth daily., Disp: 30 tablet, Rfl: 2 .  latanoprost (XALATAN) 0.005 % ophthalmic solution, , Disp: , Rfl:  .  meloxicam (MOBIC) 15 MG tablet, Take 15 mg by mouth daily., Disp: , Rfl:  .  naphazoline-pheniramine (NAPHCON-A) 0.025-0.3 % ophthalmic solution, Place 1 drop into both eyes 4 (four) times daily as needed for eye irritation., Disp: 15 mL, Rfl: 0 .  ofloxacin (OCUFLOX) 0.3 % ophthalmic solution, , Disp: , Rfl:  .  ondansetron (ZOFRAN) 4 MG tablet, Take 4 mg by mouth every 4 (four) hours as needed., Disp: , Rfl:  .  TALICIA 093-26.7-12 MG CPDR, Take by mouth., Disp: , Rfl:   Social History   Tobacco Use  Smoking Status Never Smoker   Smokeless Tobacco Never Used    No Known Allergies Objective:  There were no vitals filed for this visit. There is no height or weight on file to calculate BMI. Constitutional Well developed. Well nourished.  Vascular Dorsalis pedis pulses palpable bilaterally. Posterior tibial pulses palpable bilaterally. Capillary refill normal to all digits.  No cyanosis or clubbing noted. Pedal hair growth normal.  Neurologic Normal speech. Oriented to person, place, and time. Epicritic sensation to light touch grossly present bilaterally.  Dermatologic Nails well groomed and normal in appearance. No open wounds. No skin lesions.  Orthopedic: Normal joint ROM without pain or crepitus bilaterally. No visible deformities. No tender to palpation at the calcaneal tuber left. No pain with calcaneal squeeze left. Ankle ROM diminished range of motion left. Silfverskiold Test: positive left.   Radiographs: Taken and reviewed. No acute fractures or dislocations. No evidence of stress fracture.  Plantar heel spur present. Posterior heel spur absent.   Assessment:   1. Plantar fasciitis of left foot   2. Heel spur, left    Plan:  Patient was evaluated and treated and all questions answered.  Semiflexible/planovalgus -The patient the etiology of pes planovalgus and various treatment options were discussed.  I believe patient will benefit from custom-made orthotics to help control the hindfoot motion and support  the arch of the foot as well as take the pressure away from plantar fascia.  Patient agrees with the plan like to obtain orthotics -Patient has obtained his orthotics and is functioning well with them.  Plantar Fasciitis, left - XR reviewed as above.  - Educated on icing and stretching. Instructions given.  - Injection delivered to the plantar fascia as below. - DME: Continue plantar Fascial Brace - Pharmacologic management: Mobic sent to the pharmacy  Procedure: Injection  Tendon/Ligament Location: Left plantar fascia at the glabrous junction; medial approach. Skin Prep: alcohol Injectate: 0.5 cc 0.5% marcaine plain, 0.5 cc of 1% Lidocaine, 0.5 cc kenalog 10. Disposition: Patient tolerated procedure well. Injection site dressed with a band-aid.  No follow-ups on file. No follow-ups on file.

## 2020-12-02 ENCOUNTER — Encounter: Payer: Self-pay | Admitting: Oncology

## 2020-12-02 ENCOUNTER — Inpatient Hospital Stay: Payer: BC Managed Care – PPO

## 2020-12-02 ENCOUNTER — Other Ambulatory Visit: Payer: Self-pay

## 2020-12-02 ENCOUNTER — Inpatient Hospital Stay: Payer: BC Managed Care – PPO | Attending: Oncology | Admitting: Oncology

## 2020-12-02 VITALS — BP 112/80 | HR 74 | Temp 96.7°F | Resp 16 | Wt 170.2 lb

## 2020-12-02 DIAGNOSIS — Z801 Family history of malignant neoplasm of trachea, bronchus and lung: Secondary | ICD-10-CM | POA: Insufficient documentation

## 2020-12-02 DIAGNOSIS — D72819 Decreased white blood cell count, unspecified: Secondary | ICD-10-CM | POA: Insufficient documentation

## 2020-12-02 DIAGNOSIS — R61 Generalized hyperhidrosis: Secondary | ICD-10-CM

## 2020-12-02 DIAGNOSIS — E559 Vitamin D deficiency, unspecified: Secondary | ICD-10-CM | POA: Diagnosis not present

## 2020-12-02 DIAGNOSIS — Z803 Family history of malignant neoplasm of breast: Secondary | ICD-10-CM | POA: Diagnosis not present

## 2020-12-02 DIAGNOSIS — R5383 Other fatigue: Secondary | ICD-10-CM | POA: Insufficient documentation

## 2020-12-02 DIAGNOSIS — K219 Gastro-esophageal reflux disease without esophagitis: Secondary | ICD-10-CM | POA: Diagnosis not present

## 2020-12-02 LAB — CBC WITH DIFFERENTIAL/PLATELET
Abs Immature Granulocytes: 0 10*3/uL (ref 0.00–0.07)
Basophils Absolute: 0 10*3/uL (ref 0.0–0.1)
Basophils Relative: 0 %
Eosinophils Absolute: 0 10*3/uL (ref 0.0–0.5)
Eosinophils Relative: 1 %
HCT: 42.2 % (ref 39.0–52.0)
Hemoglobin: 13.9 g/dL (ref 13.0–17.0)
Immature Granulocytes: 0 %
Lymphocytes Relative: 43 %
Lymphs Abs: 1.3 10*3/uL (ref 0.7–4.0)
MCH: 29.3 pg (ref 26.0–34.0)
MCHC: 32.9 g/dL (ref 30.0–36.0)
MCV: 89 fL (ref 80.0–100.0)
Monocytes Absolute: 0.3 10*3/uL (ref 0.1–1.0)
Monocytes Relative: 8 %
Neutro Abs: 1.5 10*3/uL — ABNORMAL LOW (ref 1.7–7.7)
Neutrophils Relative %: 48 %
Platelets: 230 10*3/uL (ref 150–400)
RBC: 4.74 MIL/uL (ref 4.22–5.81)
RDW: 13.9 % (ref 11.5–15.5)
WBC: 3.1 10*3/uL — ABNORMAL LOW (ref 4.0–10.5)
nRBC: 0 % (ref 0.0–0.2)

## 2020-12-02 LAB — HEPATIC FUNCTION PANEL
ALT: 17 U/L (ref 0–44)
AST: 21 U/L (ref 15–41)
Albumin: 4.5 g/dL (ref 3.5–5.0)
Alkaline Phosphatase: 53 U/L (ref 38–126)
Bilirubin, Direct: <0.1 mg/dL (ref 0.0–0.2)
Total Bilirubin: 0.5 mg/dL (ref 0.3–1.2)
Total Protein: 7.2 g/dL (ref 6.5–8.1)

## 2020-12-02 LAB — VITAMIN D 25 HYDROXY (VIT D DEFICIENCY, FRACTURES): Vit D, 25-Hydroxy: 29.3 ng/mL — ABNORMAL LOW (ref 30–100)

## 2020-12-02 LAB — TECHNOLOGIST SMEAR REVIEW: Plt Morphology: ADEQUATE

## 2020-12-02 LAB — LACTATE DEHYDROGENASE: LDH: 113 U/L (ref 98–192)

## 2020-12-02 NOTE — Progress Notes (Signed)
Pt here to establish care.
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
## 2020-12-04 LAB — ZINC: Zinc: 81 ug/dL (ref 44–115)

## 2020-12-04 LAB — COPPER, SERUM: Copper: 112 ug/dL (ref 69–132)

## 2020-12-05 LAB — MULTIPLE MYELOMA PANEL, SERUM
Albumin SerPl Elph-Mcnc: 3.7 g/dL (ref 2.9–4.4)
Albumin/Glob SerPl: 1.4 (ref 0.7–1.7)
Alpha 1: 0.2 g/dL (ref 0.0–0.4)
Alpha2 Glob SerPl Elph-Mcnc: 0.6 g/dL (ref 0.4–1.0)
B-Globulin SerPl Elph-Mcnc: 0.9 g/dL (ref 0.7–1.3)
Gamma Glob SerPl Elph-Mcnc: 1 g/dL (ref 0.4–1.8)
Globulin, Total: 2.7 g/dL (ref 2.2–3.9)
IgA: 213 mg/dL (ref 90–386)
IgG (Immunoglobin G), Serum: 919 mg/dL (ref 603–1613)
IgM (Immunoglobulin M), Srm: 58 mg/dL (ref 20–172)
Total Protein ELP: 6.4 g/dL (ref 6.0–8.5)

## 2020-12-05 LAB — KAPPA/LAMBDA LIGHT CHAINS
Kappa free light chain: 18.8 mg/L (ref 3.3–19.4)
Kappa, lambda light chain ratio: 1.68 — ABNORMAL HIGH (ref 0.26–1.65)
Lambda free light chains: 11.2 mg/L (ref 5.7–26.3)

## 2020-12-06 LAB — COMP PANEL: LEUKEMIA/LYMPHOMA

## 2020-12-09 LAB — METHYLMALONIC ACID, SERUM: Methylmalonic Acid, Quantitative: 121 nmol/L (ref 0–378)

## 2020-12-13 ENCOUNTER — Ambulatory Visit (INDEPENDENT_AMBULATORY_CARE_PROVIDER_SITE_OTHER): Payer: BC Managed Care – PPO | Admitting: Podiatry

## 2020-12-13 ENCOUNTER — Other Ambulatory Visit: Payer: Self-pay

## 2020-12-13 ENCOUNTER — Encounter: Payer: Self-pay | Admitting: Podiatry

## 2020-12-13 DIAGNOSIS — M722 Plantar fascial fibromatosis: Secondary | ICD-10-CM

## 2020-12-13 NOTE — Progress Notes (Signed)
Subjective:  Patient ID: Andrew Sutton, male    DOB: 24-Jan-1971,  MRN: [id number]  Chief Complaint  Patient presents with  . Plantar Warts  . Callouses    "the heel pain is better but the callus still hurts when I walk"    50 y.o. male presents with the above complaint.  Patient presents with a follow-up of left plantar fasciitis returns.  Patient states that it is doing a lot better.  The injection helped considerably.  he has been wearing his plantar fascial brace.  He denies any other acute complaints.  Review of Systems: Negative except as noted in the HPI. Denies N/V/F/Ch.  Past Medical History:  Diagnosis Date  . Arthritis    wrists, fingers, left ankle  . GERD (gastroesophageal reflux disease)   . H/O Crohn's disease    diagnosed around age 64, but never follow up  . Heart murmur    followed by PCP    Current Outpatient Medications:  .  latanoprost (XALATAN) 0.005 % ophthalmic solution, , Disp: , Rfl:  .  meloxicam (MOBIC) 15 MG tablet, Take 15 mg by mouth daily. (Patient not taking: Reported on 12/02/2020), Disp: , Rfl:  .  meloxicam (MOBIC) 15 MG tablet, Take 1 tablet (15 mg total) by mouth daily. (Patient not taking: Reported on 12/02/2020), Disp: 30 tablet, Rfl: 2 .  naphazoline-pheniramine (NAPHCON-A) 0.025-0.3 % ophthalmic solution, Place 1 drop into both eyes 4 (four) times daily as needed for eye irritation. (Patient not taking: Reported on 12/02/2020), Disp: 15 mL, Rfl: 0 .  ofloxacin (OCUFLOX) 0.3 % ophthalmic solution, , Disp: , Rfl:  .  ondansetron (ZOFRAN) 4 MG tablet, Take 4 mg by mouth every 4 (four) hours as needed. (Patient not taking: Reported on 12/02/2020), Disp: , Rfl:  .  TALICIA 009-38.1-82 MG CPDR, Take by mouth. (Patient not taking: Reported on 12/02/2020), Disp: , Rfl:   Social History   Tobacco Use  Smoking Status Never Smoker  Smokeless Tobacco Never Used    No Known Allergies Objective:  There were no vitals filed for this visit. There is no  height or weight on file to calculate BMI. Constitutional Well developed. Well nourished.  Vascular Dorsalis pedis pulses palpable bilaterally. Posterior tibial pulses palpable bilaterally. Capillary refill normal to all digits.  No cyanosis or clubbing noted. Pedal hair growth normal.  Neurologic Normal speech. Oriented to person, place, and time. Epicritic sensation to light touch grossly present bilaterally.  Dermatologic Nails well groomed and normal in appearance. No open wounds. No skin lesions.  Orthopedic: Normal joint ROM without pain or crepitus bilaterally. No visible deformities. No tender to palpation at the calcaneal tuber left. No pain with calcaneal squeeze left. Ankle ROM diminished range of motion left. Silfverskiold Test: positive left.   Radiographs: Taken and reviewed. No acute fractures or dislocations. No evidence of stress fracture.  Plantar heel spur present. Posterior heel spur absent.   Assessment:   1. Plantar fasciitis of left foot    Plan:  Patient was evaluated and treated and all questions answered.  Semiflexible/planovalgus -The patient the etiology of pes planovalgus and various treatment options were discussed.  I believe patient will benefit from custom-made orthotics to help control the hindfoot motion and support the arch of the foot as well as take the pressure away from plantar fascia.  Patient agrees with the plan like to obtain orthotics -Patient has obtained his orthotics and is functioning well with them.  Plantar Fasciitis, left -Clinically healing  well.  At this time we will hold off on any further steroid injection as his pain has considerably improved.  I encouraged continue wearing orthotics as started coming out of the brace.  If any foot and ankle issues arise in the future he will come back and see me right away.  No follow-ups on file. No follow-ups on file.

## 2020-12-16 ENCOUNTER — Inpatient Hospital Stay (HOSPITAL_BASED_OUTPATIENT_CLINIC_OR_DEPARTMENT_OTHER): Payer: BC Managed Care – PPO | Admitting: Oncology

## 2020-12-16 ENCOUNTER — Telehealth: Payer: Self-pay | Admitting: Oncology

## 2020-12-16 ENCOUNTER — Encounter: Payer: Self-pay | Admitting: Oncology

## 2020-12-16 DIAGNOSIS — R61 Generalized hyperhidrosis: Secondary | ICD-10-CM

## 2020-12-16 DIAGNOSIS — E559 Vitamin D deficiency, unspecified: Secondary | ICD-10-CM | POA: Diagnosis not present

## 2020-12-16 DIAGNOSIS — D72819 Decreased white blood cell count, unspecified: Secondary | ICD-10-CM

## 2020-12-16 NOTE — Progress Notes (Signed)
Patient states is having some acid reflux which is causing a burning feeling in his chest which started yesterday.

## 2020-12-16 NOTE — Telephone Encounter (Signed)
Pt needs to scheduled for Lab (urine) one day next week. Left pt VM to contact office to have appt scheduled.
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
## 2020-12-23 ENCOUNTER — Inpatient Hospital Stay: Payer: BC Managed Care – PPO

## 2020-12-23 ENCOUNTER — Other Ambulatory Visit: Payer: Self-pay

## 2020-12-23 DIAGNOSIS — D72819 Decreased white blood cell count, unspecified: Secondary | ICD-10-CM

## 2020-12-26 LAB — IFE AND PE, RANDOM URINE
% BETA, Urine: 0 %
ALPHA 1 URINE: 0 %
Albumin, U: 100 %
Alpha 2, Urine: 0 %
GAMMA GLOBULIN URINE: 0 %
Total Protein, Urine: 7.1 mg/dL

## 2021-01-18 ENCOUNTER — Telehealth: Payer: Self-pay | Admitting: *Deleted

## 2021-01-18 NOTE — Telephone Encounter (Signed)
Urine protein electrophoresis and IFE was collected and resulted after last MD appt.

## 2021-01-18 NOTE — Telephone Encounter (Signed)
Patient called requesting a return call to go over his lab results. His next appointment is not until October

## 2021-01-18 NOTE — Telephone Encounter (Signed)
Urine PE and IFE is normal. No change of his follow up plan. Thanks.

## 2021-01-19 NOTE — Telephone Encounter (Signed)
Patient notified of urine test results. Pt reports that he is having real bad joint pain in legs & hands and really bad swelling to hands especially in the mornings. The pain is constant but its worse when he is rests and start activity again. Pt states that you mentioned in his past visit that his WBC count was low and is wondering if this can be related to his symptoms and if there is anything he can do regarding pain/ swelling.

## 2021-01-20 ENCOUNTER — Other Ambulatory Visit: Payer: Self-pay

## 2021-01-20 DIAGNOSIS — D72819 Decreased white blood cell count, unspecified: Secondary | ICD-10-CM

## 2021-01-24 ENCOUNTER — Telehealth: Payer: Self-pay | Admitting: Oncology

## 2021-01-24 NOTE — Telephone Encounter (Signed)
Please contact pt to schedule lab only one day this week. Thanks

## 2021-01-24 NOTE — Telephone Encounter (Signed)
Called patient to schedule possible lab appt for this afternoon. Patient stated that his grandfather passed and he is on his way to [place]. Stated that he would call back once he returns.

## 2021-01-24 NOTE — Telephone Encounter (Signed)
Andrew Sutton contacted pt and he stated that he will call back to schedule appt as he is out of town due to family emergency.

## 2021-01-31 ENCOUNTER — Ambulatory Visit (INDEPENDENT_AMBULATORY_CARE_PROVIDER_SITE_OTHER): Payer: BC Managed Care – PPO

## 2021-01-31 ENCOUNTER — Encounter: Payer: Self-pay | Admitting: Podiatry

## 2021-01-31 ENCOUNTER — Ambulatory Visit (INDEPENDENT_AMBULATORY_CARE_PROVIDER_SITE_OTHER): Payer: BC Managed Care – PPO | Admitting: Podiatry

## 2021-01-31 ENCOUNTER — Other Ambulatory Visit: Payer: Self-pay

## 2021-01-31 DIAGNOSIS — Q666 Other congenital valgus deformities of feet: Secondary | ICD-10-CM

## 2021-01-31 DIAGNOSIS — R601 Generalized edema: Secondary | ICD-10-CM

## 2021-01-31 DIAGNOSIS — M069 Rheumatoid arthritis, unspecified: Secondary | ICD-10-CM | POA: Diagnosis not present

## 2021-01-31 MED ORDER — METHYLPREDNISOLONE 4 MG PO TBPK: ORAL_TABLET | ORAL | 0 refills | Status: DC

## 2021-02-01 ENCOUNTER — Encounter: Payer: Self-pay | Admitting: Podiatry

## 2021-02-01 NOTE — Progress Notes (Signed)
Subjective:  Patient ID: Andrew Sutton, male    DOB: 10-20-1970,  MRN: [id number]  Chief Complaint  Patient presents with  . Foot Pain    Bilateral foot pain pt stated that he is having pain throughout his whole foot     50 y.o. male presents with the above complaint.  Patient presents with complaint of bilateral swelling and pain to both foot generalized.  Patient states been going for past few days has progressive gotten worse.  Patient states that he follows up with a hematologist for which she is getting work-up for rheumatoid arthritis.  He does not follow-up with a rheumatologist.  He denies any other acute complaints.  This has been going for quite some time pain scale is 10 out of 10 is sharp shooting in nature across the entire foot.  There is a throbbing sensation associated with it.   Review of Systems: Negative except as noted in the HPI. Denies N/V/F/Ch.  Past Medical History:  Diagnosis Date  . Arthritis    wrists, fingers, left ankle  . GERD (gastroesophageal reflux disease)   . H/O Crohn's disease    diagnosed around age 57, but never follow up  . Heart murmur    followed by PCP    Current Outpatient Medications:  .  methylPREDNISolone (MEDROL DOSEPAK) 4 MG TBPK tablet, Take as directed, Disp: 21 each, Rfl: 0 .  latanoprost (XALATAN) 0.005 % ophthalmic solution, , Disp: , Rfl:  .  meloxicam (MOBIC) 15 MG tablet, Take 15 mg by mouth daily. (Patient not taking: Reported on 12/16/2020), Disp: , Rfl:  .  meloxicam (MOBIC) 15 MG tablet, Take 1 tablet (15 mg total) by mouth daily. (Patient not taking: Reported on 12/16/2020), Disp: 30 tablet, Rfl: 2 .  naphazoline-pheniramine (NAPHCON-A) 0.025-0.3 % ophthalmic solution, Place 1 drop into both eyes 4 (four) times daily as needed for eye irritation. (Patient not taking: Reported on 12/16/2020), Disp: 15 mL, Rfl: 0 .  ofloxacin (OCUFLOX) 0.3 % ophthalmic solution, , Disp: , Rfl:  .  ondansetron (ZOFRAN) 4 MG tablet, Take 4 mg by  mouth every 4 (four) hours as needed. (Patient not taking: Reported on 12/16/2020), Disp: , Rfl:  .  TALICIA 417-40.8-14 MG CPDR, Take by mouth. (Patient not taking: Reported on 12/16/2020), Disp: , Rfl:   Social History   Tobacco Use  Smoking Status Never Smoker  Smokeless Tobacco Never Used    No Known Allergies Objective:  There were no vitals filed for this visit. There is no height or weight on file to calculate BMI. Constitutional Well developed. Well nourished.  Vascular Dorsalis pedis pulses palpable bilaterally. Posterior tibial pulses palpable bilaterally. Capillary refill normal to all digits.  No cyanosis or clubbing noted. Pedal hair growth normal.  Neurologic Normal speech. Oriented to person, place, and time. Epicritic sensation to light touch grossly present bilaterally.  Dermatologic Nails well groomed and normal in appearance. No open wounds. No skin lesions.  Orthopedic:  Generalized foot pain noted to all the joints to both lower extremity.  Including ankle joint first metatarsophalangeal joint midfoot joints.  No redness noted.  There is swelling present nonpitting edema.  No ulceration noted.  No pain at the Achilles tendon, peroneal tendon, posterior tibial tendon.   Radiographs: 3 views of skeletally mature adult bilateral foot: No osseous abnormalities noted no fractures noted.  Moderate bunion deformity noted.  No fractures noted.  No bony abnormalities noted.  No gross osteoarthritic changes noted Assessment:   1.  Generalized edema   2. Rheumatoid arthritis flare ([place])    Plan:  Patient was evaluated and treated and all questions answered.  Bilateral inflammatory arthritic flare possibly due to rheumatoid arthritis -I explained to the patient the etiology of arthritic flare and various treatment options were extensively discussed.  Given the patient has generalized foot to both lower extremity that are same in nature to all the joints, I believe patient  will benefit from Medrol Dosepak to help decrease acute or inflammatory flareup.  I also encouraged him to follow-up with a rheumatologist.  He states understanding and would proceed with that. -Medrol Dosepak was sent to the pharmacy -If this decreases his pain patient may benefit from low-dose prednisone to help manage it until he is on medication/work-up is done.  No follow-ups on file.

## 2021-02-20 ENCOUNTER — Telehealth: Payer: Self-pay | Admitting: Podiatry

## 2021-02-20 NOTE — Telephone Encounter (Signed)
Patient called and stated he need a note for work today. He is in severe pain. Is this ok to write

## 2021-02-21 ENCOUNTER — Encounter: Payer: Self-pay | Admitting: Family Medicine

## 2021-02-22 ENCOUNTER — Encounter: Payer: Self-pay | Admitting: *Deleted

## 2021-03-01 DIAGNOSIS — F1210 Cannabis abuse, uncomplicated: Secondary | ICD-10-CM | POA: Insufficient documentation

## 2021-05-17 ENCOUNTER — Other Ambulatory Visit: Payer: Self-pay

## 2021-05-17 ENCOUNTER — Ambulatory Visit (INDEPENDENT_AMBULATORY_CARE_PROVIDER_SITE_OTHER): Payer: BC Managed Care – PPO | Admitting: Gastroenterology

## 2021-05-17 ENCOUNTER — Encounter: Payer: Self-pay | Admitting: Gastroenterology

## 2021-05-17 VITALS — BP 111/70 | HR 69 | Temp 98.6°F | Ht 75.0 in | Wt 169.0 lb

## 2021-05-17 DIAGNOSIS — K529 Noninfective gastroenteritis and colitis, unspecified: Secondary | ICD-10-CM

## 2021-05-17 MED ORDER — NA SULFATE-K SULFATE-MG SULF 17.5-3.13-1.6 GM/177ML PO SOLN: 354.0000 mL | Freq: Once | ORAL | 0 refills | Status: AC
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
## 2021-05-30 ENCOUNTER — Encounter: Payer: Self-pay | Admitting: Gastroenterology

## 2021-05-30 ENCOUNTER — Telehealth: Payer: Self-pay | Admitting: Gastroenterology

## 2021-05-30 NOTE — Telephone Encounter (Signed)
Patient asked if he would still have to pay the 100 dollar cancellation fee. Informed patient yes since his procedure is tomorrow. He said not to cancel the procedure then. He will call back if he needs to reschedule it.

## 2021-05-30 NOTE — Telephone Encounter (Signed)
Pt. Calling to reschedule colonoscopy he is requesting a call back

## 2021-05-31 ENCOUNTER — Other Ambulatory Visit: Payer: Self-pay

## 2021-05-31 ENCOUNTER — Ambulatory Visit
Admission: RE | Admit: 2021-05-31 | Discharge: 2021-05-31 | Disposition: A | Discharge status: Home / Self Care | Payer: BC Managed Care – PPO | Attending: Gastroenterology | Admitting: Gastroenterology

## 2021-05-31 ENCOUNTER — Encounter: Payer: Self-pay | Admitting: Gastroenterology

## 2021-05-31 ENCOUNTER — Ambulatory Visit: Payer: BC Managed Care – PPO | Admitting: Anesthesiology

## 2021-05-31 ENCOUNTER — Encounter
Admission: RE | Disposition: A | Discharge status: Home / Self Care | Payer: Self-pay | Source: Home / Self Care | Attending: Gastroenterology

## 2021-05-31 DIAGNOSIS — Z79899 Other long term (current) drug therapy: Secondary | ICD-10-CM | POA: Insufficient documentation

## 2021-05-31 DIAGNOSIS — R1084 Generalized abdominal pain: Secondary | ICD-10-CM | POA: Insufficient documentation

## 2021-05-31 DIAGNOSIS — K2980 Duodenitis without bleeding: Secondary | ICD-10-CM | POA: Diagnosis not present

## 2021-05-31 DIAGNOSIS — K529 Noninfective gastroenteritis and colitis, unspecified: Secondary | ICD-10-CM | POA: Diagnosis not present

## 2021-05-31 DIAGNOSIS — K2950 Unspecified chronic gastritis without bleeding: Secondary | ICD-10-CM | POA: Insufficient documentation

## 2021-05-31 DIAGNOSIS — R0989 Other specified symptoms and signs involving the circulatory and respiratory systems: Secondary | ICD-10-CM | POA: Insufficient documentation

## 2021-05-31 HISTORY — PX: COLONOSCOPY WITH PROPOFOL: SHX5780

## 2021-05-31 HISTORY — PX: ESOPHAGOGASTRODUODENOSCOPY: SHX5428

## 2021-05-31 LAB — PANCREATIC ELASTASE, FECAL: Pancreatic Elastase, Fecal: >500 ug Elast./g (ref >200)

## 2021-05-31 LAB — GI PROFILE, STOOL, PCR

## 2021-05-31 LAB — CALPROTECTIN, FECAL: Calprotectin, Fecal: <16 ug/g (ref 0–120)

## 2021-05-31 SURGERY — COLONOSCOPY WITH PROPOFOL
Anesthesia: General

## 2021-05-31 MED ORDER — SODIUM CHLORIDE 0.9 % IV SOLN: INTRAVENOUS | Status: DC

## 2021-05-31 MED ORDER — FENTANYL CITRATE (PF) 100 MCG/2ML IJ SOLN
INTRAMUSCULAR | Status: AC
  Filled 2021-05-31: qty 2

## 2021-05-31 MED ORDER — DEXMEDETOMIDINE HCL IN NACL 200 MCG/50ML IV SOLN
INTRAVENOUS | Status: DC | PRN
  Administered 2021-05-31: 12 ug via INTRAVENOUS
  Administered 2021-05-31: 8 ug via INTRAVENOUS

## 2021-05-31 MED ORDER — PROPOFOL 10 MG/ML IV BOLUS
INTRAVENOUS | Status: DC | PRN
  Administered 2021-05-31: 50 mg via INTRAVENOUS

## 2021-05-31 MED ORDER — MIDAZOLAM HCL 2 MG/2ML IJ SOLN
INTRAMUSCULAR | Status: AC
  Filled 2021-05-31: qty 2

## 2021-05-31 MED ORDER — PROPOFOL 500 MG/50ML IV EMUL
INTRAVENOUS | Status: AC
  Filled 2021-05-31: qty 50

## 2021-05-31 MED ORDER — OMEPRAZOLE 40 MG PO CPDR: 40.0000 mg | DELAYED_RELEASE_CAPSULE | Freq: Every day | ORAL | 0 refills | Status: AC

## 2021-05-31 MED ORDER — LIDOCAINE HCL (PF) 2 % IJ SOLN
INTRAMUSCULAR | Status: AC
  Filled 2021-05-31: qty 5

## 2021-05-31 MED ORDER — MIDAZOLAM HCL 2 MG/2ML IJ SOLN
INTRAMUSCULAR | Status: DC | PRN
  Administered 2021-05-31 (×2): 2 mg via INTRAVENOUS

## 2021-05-31 MED ORDER — PROPOFOL 500 MG/50ML IV EMUL
INTRAVENOUS | Status: DC | PRN
  Administered 2021-05-31: 75 ug/kg/min via INTRAVENOUS

## 2021-05-31 MED ORDER — DEXMEDETOMIDINE HCL IN NACL 200 MCG/50ML IV SOLN
INTRAVENOUS | Status: AC
  Filled 2021-05-31: qty 50

## 2021-05-31 MED ORDER — LIDOCAINE HCL (CARDIAC) PF 100 MG/5ML IV SOSY
PREFILLED_SYRINGE | INTRAVENOUS | Status: DC | PRN
  Administered 2021-05-31: 50 mg via INTRAVENOUS

## 2021-05-31 MED ORDER — FENTANYL CITRATE (PF) 100 MCG/2ML IJ SOLN
INTRAMUSCULAR | Status: DC | PRN
  Administered 2021-05-31 (×2): 50 ug via INTRAVENOUS
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
## 2021-05-31 NOTE — Anesthesia Preprocedure Evaluation (Signed)
Anesthesia Evaluation  Patient identified by MRN, date of birth, ID band Patient awake    Reviewed: Allergy & Precautions, NPO status , [name] Chart, lab work & pertinent test results  History of Anesthesia Complications Negative for: history of anesthetic complications  Airway Mallampati: II  TM Distance: >3 FB Neck ROM: Full    Dental no notable dental hx.    Pulmonary neg pulmonary ROS, neg sleep apnea, neg COPD,    breath sounds clear to auscultation- rhonchi (-) wheezing      Cardiovascular Exercise Tolerance: Good (-) hypertension(-) CAD, (-) Past MI, (-) Cardiac Stents and (-) CABG  Rhythm:Regular Rate:Normal - Systolic murmurs and - Diastolic murmurs    Neuro/Psych neg Seizures negative neurological ROS  negative psych ROS   GI/Hepatic Neg liver ROS, GERD  ,  Endo/Other  negative endocrine ROSneg diabetes  Renal/GU negative Renal ROS     Musculoskeletal  (+) Arthritis ,   Abdominal (+) - obese,   Peds  Hematology negative hematology ROS (+)   Anesthesia Other Findings Past Medical History: No date: Arthritis     Comment:  wrists, fingers, left ankle No date: GERD (gastroesophageal reflux disease) No date: H/O Crohn's disease     Comment:  diagnosed around age 28, but never follow up No date: Heart murmur     Comment:  followed by PCP   Reproductive/Obstetrics                             Anesthesia Physical Anesthesia Plan  ASA: 2  Anesthesia Plan: General   Post-op Pain Management:    Induction: Intravenous  PONV Risk Score and Plan: 1 and Propofol infusion  Airway Management Planned: Natural Airway  Additional Equipment:   Intra-op Plan:   Post-operative Plan:   Informed Consent: I have reviewed the patients History and Physical, chart, labs and discussed the procedure including the risks, benefits and alternatives for the proposed anesthesia with the  patient or authorized representative who has indicated his/her understanding and acceptance.     Dental advisory given  Plan Discussed with: CRNA and Anesthesiologist  Anesthesia Plan Comments:         Anesthesia Quick Evaluation

## 2021-05-31 NOTE — Anesthesia Postprocedure Evaluation (Signed)
Anesthesia Post Note  Patient: Andrew Sutton  Procedure(s) Performed: COLONOSCOPY WITH PROPOFOL ESOPHAGOGASTRODUODENOSCOPY (EGD)  Patient location during evaluation: Endoscopy Anesthesia Type: General Level of consciousness: awake and alert and oriented Pain management: pain level controlled Vital Signs Assessment: post-procedure vital signs reviewed and stable Respiratory status: spontaneous breathing, nonlabored ventilation and respiratory function stable Cardiovascular status: blood pressure returned to baseline and stable Postop Assessment: no signs of nausea or vomiting Anesthetic complications: no   No notable events documented.   Last Vitals:  Vitals:   05/31/21 0930 05/31/21 1102  BP: 110/77 91/60  Resp: 16   Temp: (!) 35.9 C (!) 35.8 C    Last Pain:  Vitals:   05/31/21 1132  TempSrc:   PainSc: 0-No pain                 [name]

## 2021-05-31 NOTE — Transfer of Care (Signed)
Immediate Anesthesia Transfer of Care Note  Patient: Andrew Sutton  Procedure(s) Performed: COLONOSCOPY WITH PROPOFOL ESOPHAGOGASTRODUODENOSCOPY (EGD)  Patient Location: PACU  Anesthesia Type:General  Level of Consciousness: sedated  Airway & Oxygen Therapy: Patient Spontanous Breathing and Patient connected to nasal cannula oxygen  Post-op Assessment: Report given to RN and Post -op Vital signs reviewed and stable  Post vital signs: Reviewed and stable  Last Vitals:  Vitals Value Taken Time  BP 91/60 05/31/21 1101  Temp    Pulse 71 05/31/21 1102  Resp 18 05/31/21 1102  SpO2 100 % 05/31/21 1102  Vitals shown include unvalidated device data.  Last Pain:  Vitals:   05/31/21 0930  TempSrc: Temporal  PainSc: 0-No pain         Complications: No notable events documented.

## 2021-05-31 NOTE — Op Note (Signed)
[facility] Gastroenterology Patient Name: Andrew Sutton Procedure Date: 05/31/2021 10:18 AM MRN: [id number] Account #: [id number] Date of Birth: 23-Oct-1970 Admit Type: Outpatient Age: 50 Room: [facility] ENDO ROOM 4 Gender: Male Note Status: Finalized Instrument Name: Upper Endoscope [id number] Procedure:             Upper GI endoscopy Indications:           Globus sensation Providers:             Lin Landsman MD, MD Referring MD:          No Local Md, MD (Referring MD) Medicines:             General Anesthesia Complications:         No immediate complications. Estimated blood loss: None. Procedure:             Pre-Anesthesia Assessment:                        - Prior to the procedure, a History and Physical was                         performed, and patient medications and allergies were                         reviewed. The patient is competent. The risks and                         benefits of the procedure and the sedation options and                         risks were discussed with the patient. All questions                         were answered and informed consent was obtained.                         Patient identification and proposed procedure were                         verified by the physician, the nurse, the                         anesthesiologist, the anesthetist and the technician                         in the pre-procedure area in the procedure room in the                         endoscopy suite. Mental Status Examination: alert and                         oriented. Airway Examination: normal oropharyngeal                         airway and neck mobility. Respiratory Examination:                         clear to auscultation. CV Examination: normal.  Prophylactic Antibiotics: The patient does not require                         prophylactic antibiotics. Prior Anticoagulants: The                         patient has taken no  previous anticoagulant or                         antiplatelet agents. ASA Grade Assessment: II - A                         patient with mild systemic disease. After reviewing                         the risks and benefits, the patient was deemed in                         satisfactory condition to undergo the procedure. The                         anesthesia plan was to use general anesthesia.                         Immediately prior to administration of medications,                         the patient was re-assessed for adequacy to receive                         sedatives. The heart rate, respiratory rate, oxygen                         saturations, blood pressure, adequacy of pulmonary                         ventilation, and response to care were monitored                         throughout the procedure. The physical status of the                         patient was re-assessed after the procedure.                        After obtaining informed consent, the endoscope was                         passed under direct vision. Throughout the procedure,                         the patient's blood pressure, pulse, and oxygen                         saturations were monitored continuously. The Endoscope                         was introduced through the mouth, and advanced to the  second part of duodenum. The upper GI endoscopy was                         accomplished without difficulty. The patient tolerated                         the procedure well. Findings:      Diffuse mildly erythematous mucosa without active bleeding and with no       stigmata of bleeding was found in the duodenal bulb. Biopsies were taken       with a cold forceps for histology.      The second portion of the duodenum was normal.      The entire examined stomach was normal. Biopsies were taken with a cold       forceps for Helicobacter pylori testing.      Esophagogastric landmarks were  identified: the gastroesophageal junction       was found at 43 cm from the incisors.      The gastroesophageal junction and examined esophagus were normal.       Biopsies were taken with a cold forceps for histology. Impression:            - Erythematous duodenopathy. Biopsied.                        - Normal second portion of the duodenum.                        - Normal stomach. Biopsied.                        - Esophagogastric landmarks identified.                        - Normal gastroesophageal junction and esophagus.                         Biopsied. Recommendation:        - Await pathology results.                        - Follow an antireflux regimen.                        - Use Prilosec (omeprazole) 40 mg PO daily for 4 weeks.                        - Proceed with colonoscopy as scheduled                        See colonoscopy report Procedure Code(s):     --- Professional ---                        [phone], Esophagogastroduodenoscopy, flexible,                         transoral; with biopsy, single or multiple Diagnosis Code(s):     --- Professional ---                        K31.89, Other diseases of stomach and duodenum  F45.8, Other somatoform disorders CPT copyright 2019 American Medical Association. All rights reserved. The codes documented in this report are preliminary and upon coder review may  be revised to meet current compliance requirements. Dr. Ulyess Mort Lin Landsman MD, MD 05/31/2021 10:45:13 AM This report has been signed electronically. Number of Addenda: 0 Note Initiated On: 05/31/2021 10:18 AM Estimated Blood Loss:  Estimated blood loss: none.      [facility]

## 2021-05-31 NOTE — Op Note (Signed)
[facility] Gastroenterology Patient Name: Andrew Sutton Procedure Date: 05/31/2021 10:17 AM MRN: [id number] Account #: [id number] Date of Birth: 07-Jun-1971 Admit Type: Outpatient Age: 50 Room: [facility] ENDO ROOM 4 Gender: Male Note Status: Finalized Instrument Name: Colonoscope [id number] Procedure:             Colonoscopy Indications:           Last colonoscopy: November 2020, Generalized abdominal                         pain, Chronic diarrhea Providers:             Lin Landsman MD, MD Referring MD:          No Local Md, MD (Referring MD) Medicines:             General Anesthesia Complications:         No immediate complications. Estimated blood loss: None. Procedure:             Pre-Anesthesia Assessment:                        - Prior to the procedure, a History and Physical was                         performed, and patient medications and allergies were                         reviewed. The patient is competent. The risks and                         benefits of the procedure and the sedation options and                         risks were discussed with the patient. All questions                         were answered and informed consent was obtained.                         Patient identification and proposed procedure were                         verified by the physician, the nurse, the                         anesthesiologist, the anesthetist and the technician                         in the pre-procedure area in the procedure room in the                         endoscopy suite. Mental Status Examination: alert and                         oriented. Airway Examination: normal oropharyngeal                         airway and neck mobility. Respiratory Examination:  clear to auscultation. CV Examination: normal.                         Prophylactic Antibiotics: The patient does not require                         prophylactic  antibiotics. Prior Anticoagulants: The                         patient has taken no previous anticoagulant or                         antiplatelet agents. ASA Grade Assessment: II - A                         patient with mild systemic disease. After reviewing                         the risks and benefits, the patient was deemed in                         satisfactory condition to undergo the procedure. The                         anesthesia plan was to use general anesthesia.                         Immediately prior to administration of medications,                         the patient was re-assessed for adequacy to receive                         sedatives. The heart rate, respiratory rate, oxygen                         saturations, blood pressure, adequacy of pulmonary                         ventilation, and response to care were monitored                         throughout the procedure. The physical status of the                         patient was re-assessed after the procedure.                        After obtaining informed consent, the colonoscope was                         passed under direct vision. Throughout the procedure,                         the patient's blood pressure, pulse, and oxygen                         saturations were monitored continuously. The  Colonoscope was introduced through the anus and                         advanced to the 10 cm into the ileum. The colonoscopy                         was performed without difficulty. The patient                         tolerated the procedure well. The quality of the bowel                         preparation was adequate. Findings:      The perianal and digital rectal examinations were normal. Pertinent       negatives include normal sphincter tone and no palpable rectal lesions.      The terminal ileum appeared normal.      Normal mucosa was found in the entire colon. Biopsies were taken  with a       cold forceps for histology.      The retroflexed view of the distal rectum and anal verge was normal and       showed no anal or rectal abnormalities. Impression:            - The examined portion of the ileum was normal.                        - Normal mucosa in the entire examined colon. Biopsied.                        - The distal rectum and anal verge are normal on                         retroflexion view. Recommendation:        - Discharge patient to home (with escort).                        - Resume previous diet today.                        - Continue present medications.                        - Await pathology results.                        - Return to my office as previously scheduled. Procedure Code(s):     --- Professional ---                        [phone], Colonoscopy, flexible; with biopsy, single or                         multiple Diagnosis Code(s):     --- Professional ---                        R10.84, Generalized abdominal pain                        K52.9, Noninfective gastroenteritis and colitis,  unspecified CPT copyright 2019 American Medical Association. All rights reserved. The codes documented in this report are preliminary and upon coder review may  be revised to meet current compliance requirements. Dr. Ulyess Mort Lin Landsman MD, MD 05/31/2021 10:59:26 AM This report has been signed electronically. Number of Addenda: 0 Note Initiated On: 05/31/2021 10:17 AM Scope Withdrawal Time: 0 hours 8 minutes 40 seconds  Total Procedure Duration: 0 hours 10 minutes 11 seconds  Estimated Blood Loss:  Estimated blood loss: none.      [facility]

## 2021-06-01 ENCOUNTER — Telehealth: Payer: Self-pay

## 2021-06-01 LAB — SURGICAL PATHOLOGY

## 2021-06-01 NOTE — Telephone Encounter (Signed)
Spoke with patient he understands his results and will follow up as scheduled

## 2021-06-01 NOTE — Telephone Encounter (Signed)
-----   Message from Lin Landsman, MD sent at 06/01/2021 12:47 PM EDT ----- Hope  Please inform patient that his pathology results from recent upper endoscopy and colonoscopy all came back unremarkable.  I will see him for follow-up as scheduled  Rohini Vanga

## 2021-06-02 ENCOUNTER — Encounter: Payer: Self-pay | Admitting: Gastroenterology

## 2021-06-23 ENCOUNTER — Other Ambulatory Visit: Payer: Self-pay

## 2021-06-23 ENCOUNTER — Ambulatory Visit: Payer: BC Managed Care – PPO | Admitting: Oncology

## 2021-06-23 ENCOUNTER — Inpatient Hospital Stay: Payer: BC Managed Care – PPO

## 2021-06-23 ENCOUNTER — Inpatient Hospital Stay: Payer: BC Managed Care – PPO | Attending: Oncology | Admitting: Oncology

## 2021-06-23 VITALS — BP 109/72 | HR 79 | Temp 98.8°F | Resp 18 | Wt 164.0 lb

## 2021-06-23 DIAGNOSIS — Z803 Family history of malignant neoplasm of breast: Secondary | ICD-10-CM | POA: Insufficient documentation

## 2021-06-23 DIAGNOSIS — F1290 Cannabis use, unspecified, uncomplicated: Secondary | ICD-10-CM | POA: Insufficient documentation

## 2021-06-23 DIAGNOSIS — Z801 Family history of malignant neoplasm of trachea, bronchus and lung: Secondary | ICD-10-CM | POA: Diagnosis not present

## 2021-06-23 DIAGNOSIS — K529 Noninfective gastroenteritis and colitis, unspecified: Secondary | ICD-10-CM | POA: Insufficient documentation

## 2021-06-23 DIAGNOSIS — E038 Other specified hypothyroidism: Secondary | ICD-10-CM | POA: Insufficient documentation

## 2021-06-23 DIAGNOSIS — E559 Vitamin D deficiency, unspecified: Secondary | ICD-10-CM | POA: Diagnosis not present

## 2021-06-23 DIAGNOSIS — Z808 Family history of malignant neoplasm of other organs or systems: Secondary | ICD-10-CM | POA: Insufficient documentation

## 2021-06-23 DIAGNOSIS — R7982 Elevated C-reactive protein (CRP): Secondary | ICD-10-CM | POA: Diagnosis not present

## 2021-06-23 DIAGNOSIS — R1013 Epigastric pain: Secondary | ICD-10-CM | POA: Diagnosis not present

## 2021-06-23 DIAGNOSIS — R61 Generalized hyperhidrosis: Secondary | ICD-10-CM | POA: Diagnosis not present

## 2021-06-23 DIAGNOSIS — D72819 Decreased white blood cell count, unspecified: Secondary | ICD-10-CM | POA: Diagnosis not present

## 2021-06-23 LAB — CBC WITH DIFFERENTIAL/PLATELET
Abs Immature Granulocytes: 0.01 10*3/uL (ref 0.00–0.07)
Basophils Absolute: 0 10*3/uL (ref 0.0–0.1)
Basophils Relative: 1 %
Eosinophils Absolute: 0.2 10*3/uL (ref 0.0–0.5)
Eosinophils Relative: 4 %
HCT: 42.1 % (ref 39.0–52.0)
Hemoglobin: 14 g/dL (ref 13.0–17.0)
Immature Granulocytes: 0 %
Lymphocytes Relative: 38 %
Lymphs Abs: 1.4 10*3/uL (ref 0.7–4.0)
MCH: 29.2 pg (ref 26.0–34.0)
MCHC: 33.3 g/dL (ref 30.0–36.0)
MCV: 87.9 fL (ref 80.0–100.0)
Monocytes Absolute: 0.3 10*3/uL (ref 0.1–1.0)
Monocytes Relative: 8 %
Neutro Abs: 1.8 10*3/uL (ref 1.7–7.7)
Neutrophils Relative %: 49 %
Platelets: 211 10*3/uL (ref 150–400)
RBC: 4.79 MIL/uL (ref 4.22–5.81)
RDW: 13 % (ref 11.5–15.5)
WBC: 3.7 10*3/uL — ABNORMAL LOW (ref 4.0–10.5)
nRBC: 0 % (ref 0.0–0.2)

## 2021-06-23 LAB — COMPREHENSIVE METABOLIC PANEL
ALT: 16 U/L (ref 0–44)
AST: 19 U/L (ref 15–41)
Albumin: 4.3 g/dL (ref 3.5–5.0)
Alkaline Phosphatase: 66 U/L (ref 38–126)
Anion gap: 5 (ref 5–15)
BUN: 12 mg/dL (ref 6–20)
CO2: 30 mmol/L (ref 22–32)
Calcium: 9.2 mg/dL (ref 8.9–10.3)
Chloride: 104 mmol/L (ref 98–111)
Creatinine, Ser: 1.14 mg/dL (ref 0.61–1.24)
GFR, Estimated: >60 mL/min (ref >60)
Glucose, Bld: 78 mg/dL (ref 70–99)
Potassium: 4.1 mmol/L (ref 3.5–5.1)
Sodium: 139 mmol/L (ref 135–145)
Total Bilirubin: 0.6 mg/dL (ref 0.3–1.2)
Total Protein: 6.7 g/dL (ref 6.5–8.1)

## 2021-06-23 LAB — C-REACTIVE PROTEIN: CRP: 1 mg/dL — ABNORMAL HIGH (ref <1.0)

## 2021-06-23 LAB — LACTATE DEHYDROGENASE: LDH: 101 U/L (ref 98–192)

## 2021-06-23 LAB — SEDIMENTATION RATE: Sed Rate: 1 mm/hr (ref 0–15)

## 2021-06-23 NOTE — Progress Notes (Signed)
6 month follow-up for leukopenia. Denies any changes in medical history. Following GI for abdominal pain. Reports that he had night sweats in the past that resolved, but they have since returned.
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
## 2021-06-24 LAB — ANA W/REFLEX: Anti Nuclear Antibody (ANA): NEGATIVE

## 2021-11-02 ENCOUNTER — Other Ambulatory Visit: Payer: Self-pay

## 2021-11-02 ENCOUNTER — Ambulatory Visit (INDEPENDENT_AMBULATORY_CARE_PROVIDER_SITE_OTHER): Payer: BC Managed Care – PPO | Admitting: Podiatry

## 2021-11-02 ENCOUNTER — Encounter: Payer: Self-pay | Admitting: Podiatry

## 2021-11-02 ENCOUNTER — Ambulatory Visit (INDEPENDENT_AMBULATORY_CARE_PROVIDER_SITE_OTHER): Payer: BC Managed Care – PPO

## 2021-11-02 ENCOUNTER — Encounter: Payer: Self-pay | Admitting: *Deleted

## 2021-11-02 DIAGNOSIS — Z87828 Personal history of other (healed) physical injury and trauma: Secondary | ICD-10-CM

## 2021-11-02 DIAGNOSIS — M7750 Other enthesopathy of unspecified foot: Secondary | ICD-10-CM | POA: Diagnosis not present

## 2021-11-02 DIAGNOSIS — M7752 Other enthesopathy of left foot: Secondary | ICD-10-CM | POA: Diagnosis not present

## 2021-11-02 DIAGNOSIS — M25372 Other instability, left ankle: Secondary | ICD-10-CM | POA: Diagnosis not present

## 2021-11-07 ENCOUNTER — Other Ambulatory Visit: Payer: Self-pay

## 2021-11-07 ENCOUNTER — Ambulatory Visit (INDEPENDENT_AMBULATORY_CARE_PROVIDER_SITE_OTHER): Payer: BC Managed Care – PPO | Admitting: Podiatry

## 2021-11-07 DIAGNOSIS — M7672 Peroneal tendinitis, left leg: Secondary | ICD-10-CM | POA: Diagnosis not present

## 2021-11-07 NOTE — Progress Notes (Signed)
?  Subjective:  ?Patient ID: Andrew Sutton, male    DOB: 1971-06-10,  MRN: [id number] ? ?Chief Complaint  ?Patient presents with  ? Ankle Pain  ? ? ?51 y.o. male presents with the above complaint. Patient presents with left ankle pain and instability.  Patient has a history of repair decades ago.  He states that this ankle has been hurting since then.  He would like to get it evaluated.  He has not seen anyone else prior to seeing me for this particular pain.  He has tried heel different type of shoes hightop shoes bracing none of which has helped.  He would like to discuss further about imaging as well as possible surgical consultation if needed.  He denies any other acute complaints. ? ? ?Review of Systems: Negative except as noted in the HPI. Denies N/V/F/Ch. ? ?Past Medical History:  ?Diagnosis Date  ? Arthritis   ? wrists, fingers, left ankle  ? GERD (gastroesophageal reflux disease)   ? H/O Crohn's disease   ? diagnosed around age 3, but never follow up  ? Heart murmur   ? followed by PCP  ? ? ?Current Outpatient Medications:  ?  omeprazole (PRILOSEC) 40 MG capsule, Take 1 capsule (40 mg total) by mouth daily before breakfast. (Patient not taking: Reported on 06/23/2021), Disp: 30 capsule, Rfl: 0 ? ?Social History  ? ?Tobacco Use  ?Smoking Status Never  ?Smokeless Tobacco Never  ? ? ?No Known Allergies ?Objective:  ?There were no vitals filed for this visit. ?There is no height or weight on file to calculate BMI. ?Constitutional Well developed. ?Well nourished.  ?Vascular Dorsalis pedis pulses palpable bilaterally. ?Posterior tibial pulses palpable bilaterally. ?Capillary refill normal to all digits.  ?No cyanosis or clubbing noted. ?Pedal hair growth normal.  ?Neurologic Normal speech. ?Oriented to person, place, and time. ?Epicritic sensation to light touch grossly present bilaterally.  ?Dermatologic Nails well groomed and normal in appearance. ?No open wounds. ?No skin lesions.  ?Orthopedic: Pain on  palpation left lateral ankle at the level of the ATFL ligament.  Pain with plantarflexion inversion of the foot active no pain with dorsiflexion eversion of the foot passive.  Negative extension of swelling and flexor tendinitis noted.  ? ?Radiographs: 3 views of skeletally mature adult left ankle: Previous hardware noted with bone anchors.  Appears to be in okay position.  No signs of backing out or loosening noted.  No arthritic changes noted at the ankle joint.  No other bony abnormalities ankle.  At the ankle joint. ?Assessment:  ? ?1. Tendinitis of ankle   ?2. Ankle instability, left   ?3. History of ankle sprain   ? ?Plan:  ?Patient was evaluated and treated and all questions answered. ? ?Left ankle instability with a history of multiple ankle sprain and repair ?-All questions and concerns were discussed with the patient in extensive detail.  Given the amount of pain that he is having right where the ankle is no worse, I believe patient will benefit from cam boot immobilization.  I discussed with the patient the importance of cam boot.  Cam boot was dispensed ?-I will also order an MRI to assess for ATFL ligament tear. ? ?No follow-ups on file.  ?

## 2021-11-07 NOTE — Progress Notes (Signed)
?  Subjective:  ?Patient ID: Andrew Sutton, male    DOB: 05-04-1971,  MRN: [id number] ? ?Chief Complaint  ?Patient presents with  ? Foot Pain  ?  Pt stated that he is doing better and the boot does help   ? ? ?51 y.o. male presents with the above complaint.  Patient presents with follow-up of generalized bilateral foot and ankle pain.  He states he wore boot to the left side which has brought his pain down considerably.  He states that his pain is now more localized to the lateral side of the foot he would like to discuss next treatment plans. ? ? ?Review of Systems: Negative except as noted in the HPI. Denies N/V/F/Ch. ? ?Past Medical History:  ?Diagnosis Date  ? Arthritis   ? wrists, fingers, left ankle  ? GERD (gastroesophageal reflux disease)   ? H/O Crohn's disease   ? diagnosed around age 77, but never follow up  ? Heart murmur   ? followed by PCP  ? ? ?Current Outpatient Medications:  ?  omeprazole (PRILOSEC) 40 MG capsule, Take 1 capsule (40 mg total) by mouth daily before breakfast. (Patient not taking: Reported on 06/23/2021), Disp: 30 capsule, Rfl: 0 ? ?Social History  ? ?Tobacco Use  ?Smoking Status Never  ?Smokeless Tobacco Never  ? ? ?No Known Allergies ?Objective:  ?There were no vitals filed for this visit. ?There is no height or weight on file to calculate BMI. ?Constitutional Well developed. ?Well nourished.  ?Vascular Dorsalis pedis pulses palpable bilaterally. ?Posterior tibial pulses palpable bilaterally. ?Capillary refill normal to all digits.  ?No cyanosis or clubbing noted. ?Pedal hair growth normal.  ?Neurologic Normal speech. ?Oriented to person, place, and time. ?Epicritic sensation to light touch grossly present bilaterally.  ?Dermatologic Nails well groomed and normal in appearance. ?No open wounds. ?No skin lesions.  ?Orthopedic: Pain on palpation left lateral foot along the course of the peroneal tendon.  Pain on the left lateral malleoli are.  No pain at the insertion of the peroneal  tendon.  Pain with dorsiflexion eversion of the foot.  No pain with plantarflexion inversion of the foot.  No pain at the Achilles tendon, ATFL ligament, posterior tibial tendon.  ? ?Radiographs: 3 views of skeletally mature adult bilateral foot: No osseous abnormalities noted no fractures noted.  Moderate bunion deformity noted.  No fractures noted.  No bony abnormalities noted.  No gross osteoarthritic changes noted ?Assessment:  ? ?1. Peroneal tendinitis, left   ? ? ?Plan:  ?Patient was evaluated and treated and all questions answered. ? ?Left peroneal tendinitis ?-I explained to the patient the etiology of arthritic flare and various treatment options were extensively discussed.  The cam boot immobilization allow the soft tissue structure to heal appropriately and narrow the pain/localized the pain to the peroneal tendon.  He is not having any pain in any other spot.  At this time I discussed with the patient that he can benefit from transition from the boot into a Tri-Lock ankle brace and regular shoes.  He will wear his orthotics.  He denies any other acute complaints. ?-Tri-Lock ankle brace was dispensed and is functioning well. ?-We will keep him out of work until March 31st. ? ?No follow-ups on file.  ?

## 2021-11-15 ENCOUNTER — Encounter: Payer: Self-pay | Admitting: Podiatry

## 2021-11-27 ENCOUNTER — Inpatient Hospital Stay: Admission: RE | Admit: 2021-11-27 | Payer: BC Managed Care – PPO | Source: Ambulatory Visit

## 2021-11-27 ENCOUNTER — Other Ambulatory Visit: Payer: BC Managed Care – PPO

## 2021-11-30 ENCOUNTER — Ambulatory Visit: Payer: BC Managed Care – PPO | Admitting: Podiatry

## 2021-12-13 ENCOUNTER — Telehealth: Payer: Self-pay | Admitting: Podiatry

## 2021-12-13 ENCOUNTER — Encounter: Payer: Self-pay | Admitting: Podiatry

## 2021-12-13 NOTE — Telephone Encounter (Signed)
Patient called  he wants to be taken out of work for 2 weeks  - he is having a lot of pain  and it is popping and swollen again.  He needs a note for work. Please advise

## 2021-12-13 NOTE — Telephone Encounter (Signed)
Called patient and left them know that note for employment is ready

## 2021-12-19 ENCOUNTER — Ambulatory Visit (INDEPENDENT_AMBULATORY_CARE_PROVIDER_SITE_OTHER): Payer: BC Managed Care – PPO | Admitting: Podiatry

## 2021-12-19 DIAGNOSIS — M7672 Peroneal tendinitis, left leg: Secondary | ICD-10-CM

## 2021-12-19 MED ORDER — CYCLOBENZAPRINE HCL 10 MG PO TABS: 10.0000 mg | ORAL_TABLET | Freq: Three times a day (TID) | ORAL | 0 refills | Status: DC | PRN

## 2021-12-19 NOTE — Progress Notes (Signed)
?Subjective:  ?Patient ID: Andrew Sutton, male    DOB: Dec 01, 1970,  MRN: [id number] ? ?Chief Complaint  ?Patient presents with  ? Foot Pain  ?  Pt stated that when he walks a lot he gets a lot of popping in his ankle and swelling he is scheduled for his MRI on Wednesday   ? ? ?51 y.o. male presents with the above complaint.  Patient presents with a follow-up of left peroneal tendinitis.  Patient states the foot has been getting a lot of morning get it evaluated he scheduled for an MRI next week.  He denies any other acute complaints ? ? ?Review of Systems: Negative except as noted in the HPI. Denies N/V/F/Ch. ? ?Past Medical History:  ?Diagnosis Date  ? Arthritis   ? wrists, fingers, left ankle  ? GERD (gastroesophageal reflux disease)   ? H/O Crohn's disease   ? diagnosed around age 75, but never follow up  ? Heart murmur   ? followed by PCP  ? ? ?Current Outpatient Medications:  ?  cyclobenzaprine (FLEXERIL) 10 MG tablet, Take 1 tablet (10 mg total) by mouth 3 (three) times daily as needed for muscle spasms., Disp: 30 tablet, Rfl: 0 ?  omeprazole (PRILOSEC) 40 MG capsule, Take 1 capsule (40 mg total) by mouth daily before breakfast. (Patient not taking: Reported on 06/23/2021), Disp: 30 capsule, Rfl: 0 ? ?Social History  ? ?Tobacco Use  ?Smoking Status Never  ?Smokeless Tobacco Never  ? ? ?No Known Allergies ?Objective:  ?There were no vitals filed for this visit. ?There is no height or weight on file to calculate BMI. ?Constitutional Well developed. ?Well nourished.  ?Vascular Dorsalis pedis pulses palpable bilaterally. ?Posterior tibial pulses palpable bilaterally. ?Capillary refill normal to all digits.  ?No cyanosis or clubbing noted. ?Pedal hair growth normal.  ?Neurologic Normal speech. ?Oriented to person, place, and time. ?Epicritic sensation to light touch grossly present bilaterally.  ?Dermatologic Nails well groomed and normal in appearance. ?No open wounds. ?No skin lesions.  ?Orthopedic: Pain on  palpation left lateral foot along the course of the peroneal tendon.  Pain on the left lateral malleoli are.  No pain at the insertion of the peroneal tendon.  Pain with dorsiflexion eversion of the foot.  No pain with plantarflexion inversion of the foot.  No pain at the Achilles tendon, ATFL ligament, posterior tibial tendon.  ? ?Radiographs: 3 views of skeletally mature adult bilateral foot: No osseous abnormalities noted no fractures noted.  Moderate bunion deformity noted.  No fractures noted.  No bony abnormalities noted.  No gross osteoarthritic changes noted ?Assessment:  ? ?1. Peroneal tendinitis, left   ? ? ? ?Plan:  ?Patient was evaluated and treated and all questions answered. ? ?Left peroneal tendinitis with foot getting locked up in a dorsiflexed position ?-I explained to the patient the etiology of arthritic flare and various treatment options were extensively discussed.  The cam boot immobilization allow the soft tissue structure to heal appropriately and narrow the pain/localized the pain to the peroneal tendon.  He is not having any pain in any other spot.  At this time I discussed with the patient that he can benefit from transition from the boot into a Tri-Lock ankle brace and regular shoes.  He will wear his orthotics.  He denies any other acute complaints. ?-Tri-Lock ankle brace was dispensed and is functioning well. ?-Extend short-term disability for another 6 weeks. ?-MRI scheduled for Wednesday ?-Flexeril was dispensed for muscle relaxation ? ?No  follow-ups on file.  ?

## 2021-12-20 ENCOUNTER — Ambulatory Visit
Admission: RE | Admit: 2021-12-20 | Discharge: 2021-12-20 | Disposition: A | Discharge status: Home / Self Care | Payer: BC Managed Care – PPO | Source: Ambulatory Visit | Attending: Podiatry | Admitting: Podiatry

## 2021-12-20 DIAGNOSIS — M7750 Other enthesopathy of unspecified foot: Secondary | ICD-10-CM

## 2021-12-22 ENCOUNTER — Other Ambulatory Visit: Payer: Self-pay

## 2021-12-22 ENCOUNTER — Inpatient Hospital Stay: Payer: BC Managed Care – PPO | Attending: Oncology

## 2021-12-22 ENCOUNTER — Inpatient Hospital Stay (HOSPITAL_BASED_OUTPATIENT_CLINIC_OR_DEPARTMENT_OTHER): Payer: BC Managed Care – PPO | Admitting: Oncology

## 2021-12-22 ENCOUNTER — Encounter: Payer: Self-pay | Admitting: Oncology

## 2021-12-22 VITALS — BP 92/66 | HR 96 | Temp 97.4°F | Ht 75.0 in | Wt 175.0 lb

## 2021-12-22 DIAGNOSIS — R61 Generalized hyperhidrosis: Secondary | ICD-10-CM

## 2021-12-22 DIAGNOSIS — M2550 Pain in unspecified joint: Secondary | ICD-10-CM

## 2021-12-22 DIAGNOSIS — R682 Dry mouth, unspecified: Secondary | ICD-10-CM

## 2021-12-22 DIAGNOSIS — H04123 Dry eye syndrome of bilateral lacrimal glands: Secondary | ICD-10-CM

## 2021-12-22 DIAGNOSIS — E559 Vitamin D deficiency, unspecified: Secondary | ICD-10-CM

## 2021-12-22 DIAGNOSIS — D72819 Decreased white blood cell count, unspecified: Secondary | ICD-10-CM

## 2021-12-22 LAB — CBC WITH DIFFERENTIAL/PLATELET
Abs Immature Granulocytes: 0 10*3/uL (ref 0.00–0.07)
Basophils Absolute: 0 10*3/uL (ref 0.0–0.1)
Basophils Relative: 0 %
Eosinophils Absolute: 0.1 10*3/uL (ref 0.0–0.5)
Eosinophils Relative: 2 %
HCT: 43.5 % (ref 39.0–52.0)
Hemoglobin: 14.6 g/dL (ref 13.0–17.0)
Immature Granulocytes: 0 %
Lymphocytes Relative: 57 %
Lymphs Abs: 1.8 10*3/uL (ref 0.7–4.0)
MCH: 29.9 pg (ref 26.0–34.0)
MCHC: 33.6 g/dL (ref 30.0–36.0)
MCV: 89 fL (ref 80.0–100.0)
Monocytes Absolute: 0.4 10*3/uL (ref 0.1–1.0)
Monocytes Relative: 11 %
Neutro Abs: 1 10*3/uL — ABNORMAL LOW (ref 1.7–7.7)
Neutrophils Relative %: 30 %
Platelets: 206 10*3/uL (ref 150–400)
RBC: 4.89 MIL/uL (ref 4.22–5.81)
RDW: 12.8 % (ref 11.5–15.5)
WBC: 3.2 10*3/uL — ABNORMAL LOW (ref 4.0–10.5)
nRBC: 0 % (ref 0.0–0.2)

## 2021-12-22 LAB — COMPREHENSIVE METABOLIC PANEL
ALT: 12 U/L (ref 0–44)
AST: 20 U/L (ref 15–41)
Albumin: 4.2 g/dL (ref 3.5–5.0)
Alkaline Phosphatase: 61 U/L (ref 38–126)
Anion gap: 4 — ABNORMAL LOW (ref 5–15)
BUN: 12 mg/dL (ref 6–20)
CO2: 26 mmol/L (ref 22–32)
Calcium: 8.8 mg/dL — ABNORMAL LOW (ref 8.9–10.3)
Chloride: 105 mmol/L (ref 98–111)
Creatinine, Ser: 1.24 mg/dL (ref 0.61–1.24)
GFR, Estimated: >60 mL/min (ref >60)
Glucose, Bld: 106 mg/dL — ABNORMAL HIGH (ref 70–99)
Potassium: 3.8 mmol/L (ref 3.5–5.1)
Sodium: 135 mmol/L (ref 135–145)
Total Bilirubin: 0.8 mg/dL (ref 0.3–1.2)
Total Protein: 7.2 g/dL (ref 6.5–8.1)

## 2021-12-22 LAB — LACTATE DEHYDROGENASE: LDH: 95 U/L — ABNORMAL LOW (ref 98–192)

## 2021-12-22 LAB — TSH: TSH: 0.228 u[IU]/mL — ABNORMAL LOW (ref 0.350–4.500)

## 2021-12-22 LAB — VITAMIN D 25 HYDROXY (VIT D DEFICIENCY, FRACTURES): Vit D, 25-Hydroxy: 39.9 ng/mL (ref 30–100)

## 2021-12-22 LAB — T4, FREE: Free T4: 0.95 ng/dL (ref 0.61–1.12)

## 2021-12-23 LAB — SJOGREN'S SYNDROME ANTIBODS(SSA + SSB)
SSA (Ro) (ENA) Antibody, IgG: <0.2 AI (ref 0.0–0.9)
SSB (La) (ENA) Antibody, IgG: <0.2 AI (ref 0.0–0.9)
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
## 2021-12-25 ENCOUNTER — Telehealth: Payer: Self-pay

## 2021-12-25 NOTE — Telephone Encounter (Signed)
Placed order for Dr. Posey Pronto at [facility]. Faxed last encounter, demographics and insurance info to [facility].  ?

## 2021-12-27 ENCOUNTER — Ambulatory Visit: Admission: RE | Admit: 2021-12-27 | Payer: BC Managed Care – PPO | Source: Ambulatory Visit

## 2022-01-02 ENCOUNTER — Ambulatory Visit (INDEPENDENT_AMBULATORY_CARE_PROVIDER_SITE_OTHER): Payer: BC Managed Care – PPO | Admitting: Podiatry

## 2022-01-02 DIAGNOSIS — M7752 Other enthesopathy of left foot: Secondary | ICD-10-CM | POA: Diagnosis not present

## 2022-01-02 DIAGNOSIS — Z01818 Encounter for other preprocedural examination: Secondary | ICD-10-CM

## 2022-01-02 DIAGNOSIS — M25872 Other specified joint disorders, left ankle and foot: Secondary | ICD-10-CM

## 2022-01-02 DIAGNOSIS — M899 Disorder of bone, unspecified: Secondary | ICD-10-CM | POA: Diagnosis not present

## 2022-01-02 DIAGNOSIS — M949 Disorder of cartilage, unspecified: Secondary | ICD-10-CM

## 2022-01-02 NOTE — Progress Notes (Signed)
?Subjective:  ?Patient ID: Andrew Sutton, male    DOB: [birth year],  MRN: [id number] ? ?Chief Complaint  ?Patient presents with  ? Foot Pain  ?  MRI results   ? ? ?51 y.o. male presents with the above complaint.  Patient presents with a follow-up of left peroneal tendinitis/ankle capsulitis.  Patient states he is doing better has been off of his feet.  He states that he still hurts a lot.  Hurts around the ankle joint and may actually be radiating to the peroneal tendon.  He has failed all conservative treatment options including injection capsulitis/cam boot immobilization.  Physical therapy he would like to discuss surgical options at this time.  He is not able to work given the type of work demands that he requires. ? ? ?Review of Systems: Negative except as noted in the HPI. Denies N/V/F/Ch. ? ?Past Medical History:  ?Diagnosis Date  ? Arthritis   ? wrists, fingers, left ankle  ? GERD (gastroesophageal reflux disease)   ? H/O Crohn's disease   ? diagnosed around age 31, but never follow up  ? Heart murmur   ? followed by PCP  ? ? ?Current Outpatient Medications:  ?  cyclobenzaprine (FLEXERIL) 10 MG tablet, Take 1 tablet (10 mg total) by mouth 3 (three) times daily as needed for muscle spasms., Disp: 30 tablet, Rfl: 0 ?  omeprazole (PRILOSEC) 40 MG capsule, Take 1 capsule (40 mg total) by mouth daily before breakfast. (Patient not taking: Reported on 06/23/2021), Disp: 30 capsule, Rfl: 0 ? ?Social History  ? ?Tobacco Use  ?Smoking Status Never  ?Smokeless Tobacco Never  ? ? ?No Known Allergies ?Objective:  ?There were no vitals filed for this visit. ?There is no height or weight on file to calculate BMI. ?Constitutional Well developed. ?Well nourished.  ?Vascular Dorsalis pedis pulses palpable bilaterally. ?Posterior tibial pulses palpable bilaterally. ?Capillary refill normal to all digits.  ?No cyanosis or clubbing noted. ?Pedal hair growth normal.  ?Neurologic Normal speech. ?Oriented to person, place, and  time. ?Epicritic sensation to light touch grossly present bilaterally.  ?Dermatologic Nails well groomed and normal in appearance. ?No open wounds. ?No skin lesions.  ?Orthopedic: Pain on palpation left lateral foot along the course of the peroneal tendon.  Pain on the left lateral malleoli are.  No pain at the insertion of the peroneal tendon.  Pain with dorsiflexion eversion of the foot.  No pain with plantarflexion inversion of the foot.  No pain at the Achilles tendon, ATFL ligament, posterior tibial tendon. ? ?Pain into the ankle joint deep intra-articular pain noted.  This is also activated with active and passive range of motion of the ankle joint.  ? ?Radiographs: 3 views of skeletally mature adult bilateral foot: No osseous abnormalities noted no fractures noted.  Moderate bunion deformity noted.  No fractures noted.  No bony abnormalities noted.  No gross osteoarthritic changes noted ?Assessment:  ? ?1. Adhesive capsulitis of ankle, left   ?2. Ankle impingement syndrome, left   ?3. Osteochondral lesion   ?4. Encounter for preoperative examination for general surgical procedure   ? ? ? ? ?Plan:  ?Patient was evaluated and treated and all questions answered. ? ?Left peroneal tendinitis with foot getting locked up in a dorsiflexed position/osteochondral lesion/ankle capsulitis ?-I explained to the patient the etiology of arthritic flare and various treatment options were extensively discussed.  The cam boot did immobilization has not helped all conservative treatment options patient has failed.  At this time I  discussed with the patient that he will benefit from surgical intervention.  I believe his ankle pain is radiating to the lateral side of the foot causing peroneal tendon pain the MRI does not show any gross abnormality of the peroneal tendon. ?-Tri-Lock ankle brace was dispensed and is functioning well. ?-Extend short-term disability until 6 weeks after the surgery. ?-MRI was reviewed with the patient  in extensive detail which shows high-grade cartilage loss with possible ankle arthritic changes to the medial and lateral side.  Medial ankle impingement noted as well.  ?- Ultimately I discussed with the patient patient will benefit from surgical intervention with ankle arthroscopy with debridement and micro drilling of the osteochondral lesion to give him some relief.  I ultimately discussed with her this will also help [place] visualize the ankle and see how bad of the changes are in the ankle joint.  I discussed this with patient he states understand like to proceed with surgery.  I discussed my preoperative intra or postoperative plan in extensive detail. ?-Informed surgical risk consent was reviewed and read aloud to the patient.  I reviewed the films.  I have discussed my findings with the patient in great detail.  I have discussed all risks including but not limited to infection, stiffness, scarring, limp, disability, deformity, damage to blood vessels and nerves, numbness, poor healing, need for braces, arthritis, chronic pain, amputation, death.  All benefits and realistic expectations discussed in great detail.  I have made no promises as to the outcome.  I have provided realistic expectations.  I have offered the patient a 2nd opinion, which they have declined and assured me they preferred to proceed despite the risks ?-Continue Flexeril for muscle relaxer ? ?No follow-ups on file.  ?

## 2022-01-03 ENCOUNTER — Telehealth: Payer: Self-pay | Admitting: Urology

## 2022-01-03 NOTE — Telephone Encounter (Signed)
DOS - 01/29/22 ? ?ARTHROSCOPY ANKLE LEFT --- [phone] ? ?[employer]BS EFFECTIVE DATE - 07/05/20 ? ?PLAN DEDUCTIBLE - $250.00 W/ $250.00 REMAINING ?COINSURANCE - 10% ?COPAY - $0.00 ? ? ?SPOKE WITH JACKIE A. WITH [employer] AND HE STATED THAT CPT CODE 17793 NO PRIOR AUTH IS REQUIRED. ? ?REF # I [id number] ?

## 2022-01-05 ENCOUNTER — Other Ambulatory Visit: Payer: Self-pay

## 2022-01-05 DIAGNOSIS — M2550 Pain in unspecified joint: Secondary | ICD-10-CM

## 2022-01-29 ENCOUNTER — Encounter: Payer: Self-pay | Admitting: Podiatry

## 2022-01-29 ENCOUNTER — Other Ambulatory Visit: Payer: Self-pay | Admitting: Podiatry

## 2022-01-29 DIAGNOSIS — M13872 Other specified arthritis, left ankle and foot: Secondary | ICD-10-CM | POA: Diagnosis not present

## 2022-01-29 MED ORDER — OXYCODONE-ACETAMINOPHEN 5-325 MG PO TABS
1.0000 | ORAL_TABLET | ORAL | 0 refills | Status: DC | PRN
Start: 2022-01-29 — End: 2023-06-24

## 2022-01-29 MED ORDER — IBUPROFEN 800 MG PO TABS: 800.0000 mg | ORAL_TABLET | Freq: Four times a day (QID) | ORAL | 1 refills | Status: AC | PRN

## 2022-02-06 ENCOUNTER — Ambulatory Visit (INDEPENDENT_AMBULATORY_CARE_PROVIDER_SITE_OTHER): Payer: BC Managed Care – PPO | Admitting: Podiatry

## 2022-02-06 DIAGNOSIS — M949 Disorder of cartilage, unspecified: Secondary | ICD-10-CM

## 2022-02-06 DIAGNOSIS — Z9889 Other specified postprocedural states: Secondary | ICD-10-CM

## 2022-02-06 DIAGNOSIS — M25872 Other specified joint disorders, left ankle and foot: Secondary | ICD-10-CM

## 2022-02-06 DIAGNOSIS — M899 Disorder of bone, unspecified: Secondary | ICD-10-CM

## 2022-02-06 DIAGNOSIS — M7752 Other enthesopathy of left foot: Secondary | ICD-10-CM

## 2022-02-06 MED ORDER — OXYCODONE-ACETAMINOPHEN 5-325 MG PO TABS: 1.0000 | ORAL_TABLET | ORAL | 0 refills | Status: DC | PRN

## 2022-02-06 NOTE — Addendum Note (Signed)
Addended by: Boneta Lucks on: 02/06/2022 01:10 PM   Modules accepted: Orders

## 2022-02-06 NOTE — Progress Notes (Signed)
  Subjective:  Patient ID: Andrew Sutton, male    DOB: September 22, 1970,  MRN: [id number]  Chief Complaint  Patient presents with   Routine Post Op     POV #1 DOS 01/29/2022 LT ANKLE OSTEOCHONDRAL LESION DEBRIDMENT W/MICROFRACTURE & ANKLE ARTHROSCOPY W/INTERVENTION    DOS: 01/29/2022 Procedure: Left ankle arthroscopy with microfracturing of the osteochondral lesion  51 y.o. male returns for post-op check.  Patient states that he is doing well.  Normal postsurgical pain.  He denies any other acute complaints.  Review of Systems: Negative except as noted in the HPI. Denies N/V/F/Ch.  Past Medical History:  Diagnosis Date   Arthritis    wrists, fingers, left ankle   GERD (gastroesophageal reflux disease)    H/O Crohn's disease    diagnosed around age 75, but never follow up   Heart murmur    followed by PCP    Current Outpatient Medications:    cyclobenzaprine (FLEXERIL) 10 MG tablet, Take 1 tablet (10 mg total) by mouth 3 (three) times daily as needed for muscle spasms., Disp: 30 tablet, Rfl: 0   ibuprofen (ADVIL) 800 MG tablet, Take 1 tablet (800 mg total) by mouth every 6 (six) hours as needed., Disp: 60 tablet, Rfl: 1   omeprazole (PRILOSEC) 40 MG capsule, Take 1 capsule (40 mg total) by mouth daily before breakfast. (Patient not taking: Reported on 06/23/2021), Disp: 30 capsule, Rfl: 0   oxyCODONE-acetaminophen (PERCOCET) 5-325 MG tablet, Take 1 tablet by mouth every 4 (four) hours as needed for severe pain., Disp: 30 tablet, Rfl: 0  Social History   Tobacco Use  Smoking Status Never  Smokeless Tobacco Never    No Known Allergies Objective:  There were no vitals filed for this visit. There is no height or weight on file to calculate BMI. Constitutional Well developed. Well nourished.  Vascular Foot warm and well perfused. Capillary refill normal to all digits.   Neurologic Normal speech. Oriented to person, place, and time. Epicritic sensation to light touch grossly present  bilaterally.  Dermatologic Skin healing well without signs of infection. Skin edges well coapted without signs of infection.  Orthopedic: Tenderness to palpation noted about the surgical site.   Radiographs: None Assessment:   1. Adhesive capsulitis of ankle, left   2. Ankle impingement syndrome, left   3. Osteochondral lesion   4. Status post foot surgery    Plan:  Patient was evaluated and treated and all questions answered.  S/p foot surgery left -Progressing as expected post-operatively. -XR: See above -WB Status: Weightbearing as tolerated in cam boot -Sutures: Intact.  No clinical signs of Deis is noted.  No complication noted. -Medications: None -Foot redressed.  No follow-ups on file.

## 2022-02-08 ENCOUNTER — Telehealth: Payer: Self-pay | Admitting: Podiatry

## 2022-02-08 NOTE — Telephone Encounter (Signed)
I was  completing patients disability paperwork and had a few questions?  Does the pt. Have limited range of motion that impairs function? Yes or No  Internal rotation______________degrees External rotation________________degrees Flexion_____________________degrees Extension_____________________degrees

## 2022-02-09 ENCOUNTER — Other Ambulatory Visit: Payer: Self-pay

## 2022-02-20 ENCOUNTER — Ambulatory Visit (INDEPENDENT_AMBULATORY_CARE_PROVIDER_SITE_OTHER): Payer: BC Managed Care – PPO | Admitting: Podiatry

## 2022-02-20 DIAGNOSIS — M7752 Other enthesopathy of left foot: Secondary | ICD-10-CM

## 2022-02-20 DIAGNOSIS — M25872 Other specified joint disorders, left ankle and foot: Secondary | ICD-10-CM

## 2022-02-20 DIAGNOSIS — M949 Disorder of cartilage, unspecified: Secondary | ICD-10-CM

## 2022-02-20 DIAGNOSIS — M899 Disorder of bone, unspecified: Secondary | ICD-10-CM

## 2022-02-20 DIAGNOSIS — Z9889 Other specified postprocedural states: Secondary | ICD-10-CM

## 2022-02-20 MED ORDER — GABAPENTIN 300 MG PO CAPS: 300.0000 mg | ORAL_CAPSULE | Freq: Three times a day (TID) | ORAL | 3 refills | Status: AC

## 2022-02-28 NOTE — Progress Notes (Signed)
  Subjective:  Patient ID: Andrew Sutton, male    DOB: [birth year],  MRN: [id number]  Chief Complaint  Patient presents with   Routine Post Op    POV #2 DOS 01/29/2022 LT ANKLE OSTEOCHONDRAL LESION DEBRIDMENT W/MICROFRACTURE & ANKLE ARTHROSCOPY W/INTERVENTION    DOS: 01/29/2022 Procedure: Left ankle arthroscopy with microfracturing of the osteochondral lesion  52 y.o. male returns for post-op check.  Patient states that he is doing well.  Normal postsurgical pain.  He denies any other acute complaints.  Review of Systems: Negative except as noted in the HPI. Denies N/V/F/Ch.  Past Medical History:  Diagnosis Date   Arthritis    wrists, fingers, left ankle   GERD (gastroesophageal reflux disease)    H/O Crohn's disease    diagnosed around age 44, but never follow up   Heart murmur    followed by PCP    Current Outpatient Medications:    gabapentin (NEURONTIN) 300 MG capsule, Take 1 capsule (300 mg total) by mouth 3 (three) times daily., Disp: 90 capsule, Rfl: 3   cyclobenzaprine (FLEXERIL) 10 MG tablet, Take 1 tablet (10 mg total) by mouth 3 (three) times daily as needed for muscle spasms., Disp: 30 tablet, Rfl: 0   ibuprofen (ADVIL) 800 MG tablet, Take 1 tablet (800 mg total) by mouth every 6 (six) hours as needed., Disp: 60 tablet, Rfl: 1   omeprazole (PRILOSEC) 40 MG capsule, Take 1 capsule (40 mg total) by mouth daily before breakfast. (Patient not taking: Reported on 06/23/2021), Disp: 30 capsule, Rfl: 0   oxyCODONE-acetaminophen (PERCOCET) 5-325 MG tablet, Take 1 tablet by mouth every 4 (four) hours as needed for severe pain., Disp: 30 tablet, Rfl: 0   oxyCODONE-acetaminophen (PERCOCET) 5-325 MG tablet, Take 1 tablet by mouth every 4 (four) hours as needed for severe pain., Disp: 30 tablet, Rfl: 0  Social History   Tobacco Use  Smoking Status Never  Smokeless Tobacco Never    No Known Allergies Objective:  There were no vitals filed for this visit. There is no height or  weight on file to calculate BMI. Constitutional Well developed. Well nourished.  Vascular Foot warm and well perfused. Capillary refill normal to all digits.   Neurologic Normal speech. Oriented to person, place, and time. Epicritic sensation to light touch grossly present bilaterally.  Dermatologic Skin has completely epithelialized.  No clinical signs of dehiscence or infection noted  Orthopedic: Very mild tenderness to palpation noted about the surgical site.   Radiographs: None Assessment:   1. Adhesive capsulitis of ankle, left   2. Ankle impingement syndrome, left   3. Osteochondral lesion   4. Status post foot surgery     Plan:  Patient was evaluated and treated and all questions answered.  S/p foot surgery left -Progressing as expected post-operatively. -XR: See above -WB Status: Weightbearing as tolerated in cam boot -Sutures: Removed no clinical signs of Deis is noted.  No complication noted. -Medications: None -He will do home therapy for now. -He can return to work with restriction of 15-minute break every hour.  No follow-ups on file.

## 2022-03-22 ENCOUNTER — Encounter: Payer: Self-pay | Admitting: Oncology

## 2022-03-22 ENCOUNTER — Inpatient Hospital Stay (HOSPITAL_BASED_OUTPATIENT_CLINIC_OR_DEPARTMENT_OTHER): Payer: BC Managed Care – PPO | Admitting: Oncology

## 2022-03-22 ENCOUNTER — Inpatient Hospital Stay: Payer: BC Managed Care – PPO | Attending: Oncology

## 2022-03-22 VITALS — BP 101/78 | HR 86 | Temp 98.0°F | Ht 75.0 in | Wt 170.0 lb

## 2022-03-22 DIAGNOSIS — D72819 Decreased white blood cell count, unspecified: Secondary | ICD-10-CM

## 2022-03-22 DIAGNOSIS — H04123 Dry eye syndrome of bilateral lacrimal glands: Secondary | ICD-10-CM

## 2022-03-22 DIAGNOSIS — R682 Dry mouth, unspecified: Secondary | ICD-10-CM

## 2022-03-22 DIAGNOSIS — R61 Generalized hyperhidrosis: Secondary | ICD-10-CM | POA: Diagnosis not present

## 2022-03-22 LAB — COMPREHENSIVE METABOLIC PANEL
ALT: 13 U/L (ref 0–44)
AST: 20 U/L (ref 15–41)
Albumin: 3.8 g/dL (ref 3.5–5.0)
Alkaline Phosphatase: 58 U/L (ref 38–126)
Anion gap: 6 (ref 5–15)
BUN: 14 mg/dL (ref 6–20)
CO2: 26 mmol/L (ref 22–32)
Calcium: 9.1 mg/dL (ref 8.9–10.3)
Chloride: 108 mmol/L (ref 98–111)
Creatinine, Ser: 1.46 mg/dL — ABNORMAL HIGH (ref 0.61–1.24)
GFR, Estimated: 59 mL/min — ABNORMAL LOW (ref >60)
Glucose, Bld: 94 mg/dL (ref 70–99)
Potassium: 4.2 mmol/L (ref 3.5–5.1)
Sodium: 140 mmol/L (ref 135–145)
Total Bilirubin: 0.4 mg/dL (ref 0.3–1.2)
Total Protein: 6.6 g/dL (ref 6.5–8.1)

## 2022-03-22 LAB — CBC WITH DIFFERENTIAL/PLATELET
Abs Immature Granulocytes: 0 10*3/uL (ref 0.00–0.07)
Basophils Absolute: 0 10*3/uL (ref 0.0–0.1)
Basophils Relative: 0 %
Eosinophils Absolute: 0.1 10*3/uL (ref 0.0–0.5)
Eosinophils Relative: 2 %
HCT: 39.4 % (ref 39.0–52.0)
Hemoglobin: 13.2 g/dL (ref 13.0–17.0)
Immature Granulocytes: 0 %
Lymphocytes Relative: 53 %
Lymphs Abs: 1.6 10*3/uL (ref 0.7–4.0)
MCH: 29.3 pg (ref 26.0–34.0)
MCHC: 33.5 g/dL (ref 30.0–36.0)
MCV: 87.6 fL (ref 80.0–100.0)
Monocytes Absolute: 0.3 10*3/uL (ref 0.1–1.0)
Monocytes Relative: 9 %
Neutro Abs: 1.1 10*3/uL — ABNORMAL LOW (ref 1.7–7.7)
Neutrophils Relative %: 36 %
Platelets: 219 10*3/uL (ref 150–400)
RBC: 4.5 MIL/uL (ref 4.22–5.81)
RDW: 13.2 % (ref 11.5–15.5)
WBC: 3.1 10*3/uL — ABNORMAL LOW (ref 4.0–10.5)
nRBC: 0 % (ref 0.0–0.2)

## 2022-03-22 LAB — TSH: TSH: 0.484 u[IU]/mL (ref 0.350–4.500)

## 2022-03-22 LAB — T4, FREE: Free T4: 0.85 ng/dL (ref 0.61–1.12)
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
## 2022-03-29 ENCOUNTER — Ambulatory Visit (INDEPENDENT_AMBULATORY_CARE_PROVIDER_SITE_OTHER): Payer: BC Managed Care – PPO | Admitting: Podiatry

## 2022-03-29 ENCOUNTER — Encounter: Payer: Self-pay | Admitting: Podiatry

## 2022-03-29 DIAGNOSIS — T148XXA Other injury of unspecified body region, initial encounter: Secondary | ICD-10-CM

## 2022-03-29 DIAGNOSIS — Z9889 Other specified postprocedural states: Secondary | ICD-10-CM

## 2022-03-29 MED ORDER — METHYLPREDNISOLONE 4 MG PO TBPK: ORAL_TABLET | ORAL | 0 refills | Status: DC

## 2022-03-31 NOTE — Progress Notes (Signed)
Subjective:  Patient ID: Andrew Sutton, male    DOB: 1971-04-28,  MRN: [id number]  Chief Complaint  Patient presents with   Routine Post Op    51 y.o. male presents with the above complaint.  Patient presents with left ankle stiffness/cracking with pain.  Patient states he was doing well until he had a recent injury.  He states that it started bothering him.  He states been hurting since that before that he was doing really good.  No acute complaints.  He is surgery for ankle OCD lesion with debridement was on 01/29/2022.  He denies any other acute complaints pain scale 7 out of 10 he has not seen anyone else prior to seeing me.   Review of Systems: Negative except as noted in the HPI. Denies N/V/F/Ch.  Past Medical History:  Diagnosis Date   Arthritis    wrists, fingers, left ankle   GERD (gastroesophageal reflux disease)    H/O Crohn's disease    diagnosed around age 50, but never follow up   Heart murmur    followed by PCP    Current Outpatient Medications:    methylPREDNISolone (MEDROL DOSEPAK) 4 MG TBPK tablet, Take as directed, Disp: 21 each, Rfl: 0   cyclobenzaprine (FLEXERIL) 10 MG tablet, Take 1 tablet (10 mg total) by mouth 3 (three) times daily as needed for muscle spasms., Disp: 30 tablet, Rfl: 0   gabapentin (NEURONTIN) 300 MG capsule, Take 1 capsule (300 mg total) by mouth 3 (three) times daily., Disp: 90 capsule, Rfl: 3   ibuprofen (ADVIL) 800 MG tablet, Take 1 tablet (800 mg total) by mouth every 6 (six) hours as needed., Disp: 60 tablet, Rfl: 1   omeprazole (PRILOSEC) 40 MG capsule, Take 1 capsule (40 mg total) by mouth daily before breakfast. (Patient not taking: Reported on 06/23/2021), Disp: 30 capsule, Rfl: 0   oxyCODONE-acetaminophen (PERCOCET) 5-325 MG tablet, Take 1 tablet by mouth every 4 (four) hours as needed for severe pain., Disp: 30 tablet, Rfl: 0   oxyCODONE-acetaminophen (PERCOCET) 5-325 MG tablet, Take 1 tablet by mouth every 4 (four) hours as needed  for severe pain., Disp: 30 tablet, Rfl: 0  Social History   Tobacco Use  Smoking Status Never  Smokeless Tobacco Never    No Known Allergies Objective:  There were no vitals filed for this visit. There is no height or weight on file to calculate BMI. Constitutional Well developed. Well nourished.  Vascular Dorsalis pedis pulses palpable bilaterally. Posterior tibial pulses palpable bilaterally. Capillary refill normal to all digits.  No cyanosis or clubbing noted. Pedal hair growth normal.  Neurologic Normal speech. Oriented to person, place, and time. Epicritic sensation to light touch grossly present bilaterally.  Dermatologic Nails well groomed and normal in appearance. No open wounds. No skin lesions.  Orthopedic: Pain on palpation to the left ankle joint.  Pain with range of motion of the ankle joint no pain with plantarflexion of the ankle joint pain with dorsiflexion of the ankle joint.   Radiographs: None Assessment:   1. Status post foot surgery   2. Contusion of soft tissue    Plan:  Patient was evaluated and treated and all questions answered.  Left ankle stiffness/soft tissue injury -All questions and concerns were discussed with the patient in extensive detail -Given the amount of pain that he is experiencing he will benefit from a cam boot immobilization.  He already has cam boot at home. -He will place himself in a boot for next  4 weeks -Medrol Dosepak was sent to the pharmacy to help with inflammation  No follow-ups on file.

## 2022-04-26 ENCOUNTER — Ambulatory Visit (INDEPENDENT_AMBULATORY_CARE_PROVIDER_SITE_OTHER): Payer: BC Managed Care – PPO | Admitting: Podiatry

## 2022-04-26 DIAGNOSIS — T148XXA Other injury of unspecified body region, initial encounter: Secondary | ICD-10-CM

## 2022-05-02 NOTE — Progress Notes (Signed)
  Subjective:  Patient ID: Andrew Sutton, male    DOB: [birth year],  MRN: [id number]  Chief Complaint  Patient presents with   Routine Post Op    Left foot post op     51 y.o. male presents with the above complaint.  Patient presents with follow-up of left ankle stiffness/cracking pain.  He states is doing a lot better he occasionally notices but the steroid pack helped considerably.  Does not have any further pain he is return to regular activities.   Review of Systems: Negative except as noted in the HPI. Denies N/V/F/Ch.  Past Medical History:  Diagnosis Date   Arthritis    wrists, fingers, left ankle   GERD (gastroesophageal reflux disease)    H/O Crohn's disease    diagnosed around age 68, but never follow up   Heart murmur    followed by PCP    Current Outpatient Medications:    cyclobenzaprine (FLEXERIL) 10 MG tablet, Take 1 tablet (10 mg total) by mouth 3 (three) times daily as needed for muscle spasms., Disp: 30 tablet, Rfl: 0   gabapentin (NEURONTIN) 300 MG capsule, Take 1 capsule (300 mg total) by mouth 3 (three) times daily., Disp: 90 capsule, Rfl: 3   ibuprofen (ADVIL) 800 MG tablet, Take 1 tablet (800 mg total) by mouth every 6 (six) hours as needed., Disp: 60 tablet, Rfl: 1   methylPREDNISolone (MEDROL DOSEPAK) 4 MG TBPK tablet, Take as directed, Disp: 21 each, Rfl: 0   omeprazole (PRILOSEC) 40 MG capsule, Take 1 capsule (40 mg total) by mouth daily before breakfast. (Patient not taking: Reported on 06/23/2021), Disp: 30 capsule, Rfl: 0   oxyCODONE-acetaminophen (PERCOCET) 5-325 MG tablet, Take 1 tablet by mouth every 4 (four) hours as needed for severe pain., Disp: 30 tablet, Rfl: 0   oxyCODONE-acetaminophen (PERCOCET) 5-325 MG tablet, Take 1 tablet by mouth every 4 (four) hours as needed for severe pain., Disp: 30 tablet, Rfl: 0  Social History   Tobacco Use  Smoking Status Never  Smokeless Tobacco Never    No Known Allergies Objective:  There were no vitals  filed for this visit. There is no height or weight on file to calculate BMI. Constitutional Well developed. Well nourished.  Vascular Dorsalis pedis pulses palpable bilaterally. Posterior tibial pulses palpable bilaterally. Capillary refill normal to all digits.  No cyanosis or clubbing noted. Pedal hair growth normal.  Neurologic Normal speech. Oriented to person, place, and time. Epicritic sensation to light touch grossly present bilaterally.  Dermatologic Nails well groomed and normal in appearance. No open wounds. No skin lesions.  Orthopedic: No further pain on palpation to the left ankle joint.  No further pain with range of motion of the ankle joint no pain with plantarflexion of the ankle joint pain with dorsiflexion of the ankle joint.   Radiographs: None Assessment:   1. Contusion of soft tissue     Plan:  Patient was evaluated and treated and all questions answered.  Left ankle stiffness/soft tissue injury -All questions and concerns were discussed with the patient in extensive detail -Clinically healed no further pain at this time.  Patient is officially discharged from my care if any foot and ankle issues arise in the future of asked him to come back and see me.  He states understanding  No follow-ups on file.

## 2022-10-09 ENCOUNTER — Ambulatory Visit: Payer: BC Managed Care – PPO | Admitting: Podiatry

## 2022-10-09 ENCOUNTER — Ambulatory Visit (INDEPENDENT_AMBULATORY_CARE_PROVIDER_SITE_OTHER): Payer: BC Managed Care – PPO

## 2022-10-09 ENCOUNTER — Encounter: Payer: Self-pay | Admitting: Podiatry

## 2022-10-09 DIAGNOSIS — M7661 Achilles tendinitis, right leg: Secondary | ICD-10-CM | POA: Diagnosis not present

## 2022-10-09 DIAGNOSIS — M79671 Pain in right foot: Secondary | ICD-10-CM

## 2022-10-09 MED ORDER — MELOXICAM 15 MG PO TABS: 15.0000 mg | ORAL_TABLET | Freq: Every day | ORAL | 1 refills | Status: DC

## 2022-10-09 MED ORDER — METHYLPREDNISOLONE 4 MG PO TBPK: ORAL_TABLET | ORAL | 0 refills | Status: DC
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
## 2022-10-26 ENCOUNTER — Ambulatory Visit: Payer: BC Managed Care – PPO | Admitting: Podiatry

## 2023-03-22 ENCOUNTER — Inpatient Hospital Stay: Payer: BC Managed Care – PPO | Attending: Oncology

## 2023-03-22 ENCOUNTER — Inpatient Hospital Stay: Payer: BC Managed Care – PPO | Admitting: Oncology

## 2023-03-22 ENCOUNTER — Encounter: Payer: Self-pay | Admitting: Oncology

## 2023-04-17 ENCOUNTER — Other Ambulatory Visit: Payer: Self-pay | Admitting: Podiatry

## 2023-04-17 DIAGNOSIS — M7661 Achilles tendinitis, right leg: Secondary | ICD-10-CM

## 2023-05-17 ENCOUNTER — Other Ambulatory Visit: Payer: Self-pay

## 2023-05-17 ENCOUNTER — Encounter: Payer: Self-pay | Admitting: Emergency Medicine

## 2023-05-17 ENCOUNTER — Emergency Department
Admission: EM | Admit: 2023-05-17 | Discharge: 2023-05-17 | Disposition: A | Discharge status: Home / Self Care | Payer: BC Managed Care – PPO | Attending: Emergency Medicine | Admitting: Emergency Medicine

## 2023-05-17 DIAGNOSIS — R051 Acute cough: Secondary | ICD-10-CM | POA: Insufficient documentation

## 2023-05-17 DIAGNOSIS — M545 Low back pain, unspecified: Secondary | ICD-10-CM | POA: Diagnosis not present

## 2023-05-17 DIAGNOSIS — Z1152 Encounter for screening for COVID-19: Secondary | ICD-10-CM | POA: Insufficient documentation

## 2023-05-17 DIAGNOSIS — R059 Cough, unspecified: Secondary | ICD-10-CM | POA: Diagnosis present

## 2023-05-17 LAB — URINALYSIS, ROUTINE W REFLEX MICROSCOPIC
Bilirubin Urine: NEGATIVE
Glucose, UA: NEGATIVE mg/dL
Hgb urine dipstick: NEGATIVE
Ketones, ur: NEGATIVE mg/dL
Leukocytes,Ua: NEGATIVE
Nitrite: NEGATIVE
Protein, ur: NEGATIVE mg/dL
Specific Gravity, Urine: 1.025 (ref 1.005–1.030)
pH: 5 (ref 5.0–8.0)

## 2023-05-17 LAB — RESP PANEL BY RT-PCR (RSV, FLU A&B, COVID)  RVPGX2
Influenza A by PCR: NEGATIVE
Influenza B by PCR: NEGATIVE
Resp Syncytial Virus by PCR: NEGATIVE
SARS Coronavirus 2 by RT PCR: NEGATIVE

## 2023-05-17 MED ORDER — LIDOCAINE 5 % EX PTCH
2.0000 | MEDICATED_PATCH | CUTANEOUS | Status: DC
  Administered 2023-05-17: 2 via TRANSDERMAL
  Filled 2023-05-17: qty 2

## 2023-05-17 MED ORDER — BENZONATATE 100 MG PO CAPS: 100.0000 mg | ORAL_CAPSULE | Freq: Three times a day (TID) | ORAL | 0 refills | Status: AC | PRN

## 2023-05-17 MED ORDER — KETOROLAC TROMETHAMINE 15 MG/ML IJ SOLN
15.0000 mg | Freq: Once | INTRAMUSCULAR | Status: AC
  Administered 2023-05-17: 15 mg via INTRAMUSCULAR
  Filled 2023-05-17: qty 1

## 2023-05-17 MED ORDER — ACETAMINOPHEN 500 MG PO TABS
1000.0000 mg | ORAL_TABLET | Freq: Once | ORAL | Status: AC
  Administered 2023-05-17: 1000 mg via ORAL
  Filled 2023-05-17: qty 2

## 2023-05-17 NOTE — ED Triage Notes (Signed)
Pt here with co id symptoms: cough, headache, diaphoresis, and congestion. Pt also having lower back pain, pt denies falls or injury.

## 2023-05-17 NOTE — Discharge Instructions (Signed)
You were evaluated in the ED for cough and back pain.  Your respiratory panel which includes COVID, influenza and RSV is negative.  Your urinalysis is normal.  Take ibuprofen and Tylenol for pain as needed.  Please follow-up with your primary care if symptoms do not improve.

## 2023-05-17 NOTE — ED Provider Notes (Signed)
[facility] Emergency Department Provider Note     Event Date/Time   First MD Initiated Contact with Patient 05/17/23 1547     (approximate)   History   Back Pain and Cough   HPI  Andrew Sutton is a 52 y.o. male presents to the ED with intermittent dry cough spells and bodyaches x 2 days.  Associated symptoms include headache and on and off hot flashes and chills.  Patient endorses exposure to COVID by a family member at home.  Patient has not taken anything for treatment.  Patient reports his symptoms have improved being that he is able to get up and move around today.  He is also complaining of lower back pain that began yesterday.  No radiation.  Denies trauma or injury.  He believes this may be due to his coughing spells. Unknown history of kidney stones.  Patient reports he has been noticing urinary frequency outside of his normal.  Denies dysuria, fever, penile discharge and vomiting.     Physical Exam   Triage Vital Signs: ED Triage Vitals  Encounter Vitals Group     BP 05/17/23 1433 108/74     Systolic BP Percentile --      Diastolic BP Percentile --      Pulse Rate 05/17/23 1433 88     Resp 05/17/23 1433 17     Temp 05/17/23 1433 98.8 F (37.1 C)     Temp Source 05/17/23 1433 Oral     SpO2 05/17/23 1433 97 %     Weight 05/17/23 1434 169 lb 15.6 oz (77.1 kg)     Height 05/17/23 1434 6\' 3"  (1.905 m)     Head Circumference --      Peak Flow --      Pain Score 05/17/23 1434 8     Pain Loc --      Pain Education --      Exclude from Growth Chart --     Most recent vital signs: Vitals:   05/17/23 1433  BP: 108/74  Pulse: 88  Resp: 17  Temp: 98.8 F (37.1 C)  SpO2: 97%    General: Well appearing. Alert and oriented. INAD. Nontoxic  Skin:  Warm, dry and intact. No rashes or lesions noted.     Head:  NCAT.  Eyes:  PERRLA. EOMI.  Neck:   No cervical spine tenderness to palpation. Full ROM without difficulty.  CV:  Good peripheral  perfusion. RRR.  RESP:  Normal effort. LCTAB. No retractions.  ABD:  No distention. Soft, Non tender. No masses or organomegaly. No CVA tenderness bilaterally.  BACK:  Spinous process is midline without deformity or tenderness.  Tenderness to paraspinal lumbar muscles. NEURO: Cranial nerves intact. No focal deficits. Sensation and motor function intact. 5/5 muscle strength of UE & LE. Gait is steady.    ED Results / Procedures / Treatments   Labs (all labs ordered are listed, but only abnormal results are displayed) Labs Reviewed  URINALYSIS, ROUTINE W REFLEX MICROSCOPIC - Abnormal; Notable for the following components:      Result Value   Color, Urine YELLOW (*)    APPearance CLEAR (*)    All other components within normal limits  RESP PANEL BY RT-PCR (RSV, FLU A&B, COVID)  RVPGX2   No results found.  PROCEDURES:  Critical Care performed: No  Procedures  MEDICATIONS ORDERED IN ED: Medications  lidocaine (LIDODERM) 5 % 2 patch (2 patches Transdermal Patch Applied 05/17/23  1612)  ketorolac (TORADOL) 15 MG/ML injection 15 mg (15 mg Intramuscular Given 05/17/23 1612)  acetaminophen (TYLENOL) tablet 1,000 mg (1,000 mg Oral Given 05/17/23 1612)   IMPRESSION / MDM / ASSESSMENT AND PLAN / ED COURSE  I reviewed the triage vital signs and the nursing notes.                               52 y.o. male presents to the emergency department for evaluation and treatment of cough and back pain without injury. See HPI for further details.   Differential diagnosis includes, but is not limited to muscle strain, radiculopathy, UTI, kidney stone.  [name] presentation is most consistent with acute complicated illness / injury requiring diagnostic workup.  Patient is alert and oriented.  He presents nontoxic.  He is hemodynamically stable.  Respiratory panel obtained in triage.  Reassuring.  Physical exam findings are overall benign.  Patient has mild tenderness over the bilateral paraspinal  muscles his lumbar region.  Pain management provided in the ED with Toradol injection, Tylenol and lidocaine patch.  Given patient history of urinary frequency, urinalysis obtained and is reassuring.  Patient is in stable condition for discharge home with outpatient follow-up with primary care provider.  ED precautions discussed.  Encouraged Tylenol and ibuprofen at home as needed. Patient verbalizes understanding. All questions and concerns were addressed during ED visit.     FINAL CLINICAL IMPRESSION(S) / ED DIAGNOSES   Final diagnoses:  Acute cough  Acute low back pain without sciatica, unspecified back pain laterality    Rx / DC Orders   ED Discharge Orders          Ordered    benzonatate (TESSALON PERLES) 100 MG capsule  3 times daily PRN        05/17/23 1658             Note:  This document was prepared using Dragon voice recognition software and may include unintentional dictation errors.    Romeo Apple, Demarian Epps A, PA-C 05/17/23 1736    Jene Every, MD 05/17/23 [phone]

## 2023-06-17 ENCOUNTER — Encounter: Payer: Self-pay | Admitting: Emergency Medicine

## 2023-06-17 ENCOUNTER — Emergency Department
Admission: EM | Admit: 2023-06-17 | Discharge: 2023-06-17 | Disposition: A | Discharge status: Home / Self Care | Payer: BC Managed Care – PPO | Attending: Emergency Medicine | Admitting: Emergency Medicine

## 2023-06-17 DIAGNOSIS — Z1152 Encounter for screening for COVID-19: Secondary | ICD-10-CM | POA: Insufficient documentation

## 2023-06-17 DIAGNOSIS — R059 Cough, unspecified: Secondary | ICD-10-CM | POA: Diagnosis present

## 2023-06-17 DIAGNOSIS — J069 Acute upper respiratory infection, unspecified: Secondary | ICD-10-CM | POA: Insufficient documentation

## 2023-06-17 LAB — RESP PANEL BY RT-PCR (RSV, FLU A&B, COVID)  RVPGX2
Influenza A by PCR: NEGATIVE
Influenza B by PCR: NEGATIVE
Resp Syncytial Virus by PCR: NEGATIVE
SARS Coronavirus 2 by RT PCR: NEGATIVE

## 2023-06-17 MED ORDER — PSEUDOEPH-BROMPHEN-DM 30-2-10 MG/5ML PO SYRP: 5.0000 mL | ORAL_SOLUTION | Freq: Four times a day (QID) | ORAL | 0 refills | Status: DC | PRN

## 2023-06-17 MED ORDER — DOXYCYCLINE HYCLATE 100 MG PO TABS: 100.0000 mg | ORAL_TABLET | Freq: Two times a day (BID) | ORAL | 0 refills | Status: AC

## 2023-06-17 NOTE — ED Provider Notes (Signed)
[facility] Provider Note    Event Date/Time   First MD Initiated Contact with Patient 06/17/23 1212     (approximate)   History   Nasal Congestion, Cough, and Diarrhea   HPI  Andrew Sutton is a 52 y.o. male with history of Crohn's disease, heart murmur, GERD presents emergency department with cough and congestion for 3 days.  Some diarrhea.  States cough is productive of green mucus.  No chest pain or shortness of breath.      Physical Exam   Triage Vital Signs: ED Triage Vitals  Encounter Vitals Group     BP 06/17/23 1114 105/78     Systolic BP Percentile --      Diastolic BP Percentile --      Pulse Rate 06/17/23 1114 85     Resp 06/17/23 1114 18     Temp 06/17/23 1114 98.8 F (37.1 C)     Temp Source 06/17/23 1114 Oral     SpO2 06/17/23 1114 97 %     Weight 06/17/23 1109 176 lb (79.8 kg)     Height 06/17/23 1109 6\' 3"  (1.905 m)     Head Circumference --      Peak Flow --      Pain Score 06/17/23 1109 8     Pain Loc --      Pain Education --      Exclude from Growth Chart --     Most recent vital signs: Vitals:   06/17/23 1114  BP: 105/78  Pulse: 85  Resp: 18  Temp: 98.8 F (37.1 C)  SpO2: 97%     General: Awake, no distress.   CV:  Good peripheral perfusion. regular rate and  rhythm Resp:  Normal effort. Lungs CTA Abd:  No distention.   Other:      ED Results / Procedures / Treatments   Labs (all labs ordered are listed, but only abnormal results are displayed) Labs Reviewed  RESP PANEL BY RT-PCR (RSV, FLU A&B, COVID)  RVPGX2     EKG     RADIOLOGY     PROCEDURES:   Procedures   MEDICATIONS ORDERED IN ED: Medications - No data to display   IMPRESSION / MDM / ASSESSMENT AND PLAN / ED COURSE  I reviewed the triage vital signs and the nursing notes.                              Differential diagnosis includes, but is not limited to, COVID, influenza, RSV, CAP, acute URI  [name]  presentation is most consistent with acute illness / injury with system symptoms.   Respiratory panel is reassuring  [name] exam is most consistent with acute URI.  Placed on doxycycline, Bromfed cough syrup.  He is to use Imodium A-D if diarrhea worsens.  Return emergency department worsening.  Patient is in agreement treatment plan.  Discharged stable condition.  He was given a work note       FINAL CLINICAL IMPRESSION(S) / ED DIAGNOSES   Final diagnoses:  Acute URI     Rx / DC Orders   ED Discharge Orders          Ordered    doxycycline (VIBRA-TABS) 100 MG tablet  2 times daily        06/17/23 1219    brompheniramine-pseudoephedrine-DM 30-2-10 MG/5ML syrup  4 times daily PRN        06/17/23  1219             Note:  This document was prepared using Dragon voice recognition software and may include unintentional dictation errors.    Faythe Ghee, PA-C 06/17/23 1255    Pilar Jarvis, MD 06/17/23 [phone]

## 2023-06-17 NOTE — ED Provider Triage Note (Signed)
Emergency Medicine Provider Triage Evaluation Note  Andrew Sutton , a 52 y.o. male  was evaluated in triage.  Pt complains of fever, chills, body aches, cough, diarrhea for 3 days.  Review of Systems  Positive:  Negative:   Physical Exam  Ht 6\' 3"  (1.905 m)   Wt 79.8 kg   BMI 22.00 kg/m  Gen:   Awake, no distress   Resp:  Normal effort  MSK:   Moves extremities without difficulty  Other:    Medical Decision Making  Medically screening exam initiated at 11:11 AM.  Appropriate orders placed.  Gentry Fitz was informed that the remainder of the evaluation will be completed by another provider, this initial triage assessment does not replace that evaluation, and the importance of remaining in the ED until their evaluation is complete.     Faythe Ghee, PA-C 06/17/23 1113

## 2023-06-17 NOTE — ED Triage Notes (Signed)
Cough, congestion, chills x 3 days. Today started to have diarrhea.

## 2023-06-24 ENCOUNTER — Emergency Department
Admission: EM | Admit: 2023-06-24 | Discharge: 2023-06-24 | Disposition: A | Discharge status: Home / Self Care | Payer: BC Managed Care – PPO | Attending: Emergency Medicine | Admitting: Emergency Medicine

## 2023-06-24 ENCOUNTER — Other Ambulatory Visit: Payer: Self-pay

## 2023-06-24 ENCOUNTER — Encounter: Payer: Self-pay | Admitting: Emergency Medicine

## 2023-06-24 ENCOUNTER — Emergency Department: Payer: BC Managed Care – PPO

## 2023-06-24 DIAGNOSIS — M25521 Pain in right elbow: Secondary | ICD-10-CM | POA: Diagnosis present

## 2023-06-24 DIAGNOSIS — M25421 Effusion, right elbow: Secondary | ICD-10-CM | POA: Diagnosis not present

## 2023-06-24 LAB — CBC WITH DIFFERENTIAL/PLATELET
Abs Immature Granulocytes: 0.02 10*3/uL (ref 0.00–0.07)
Basophils Absolute: 0 10*3/uL (ref 0.0–0.1)
Basophils Relative: 0 %
Eosinophils Absolute: 0 10*3/uL (ref 0.0–0.5)
Eosinophils Relative: 1 %
HCT: 39.5 % (ref 39.0–52.0)
Hemoglobin: 13.2 g/dL (ref 13.0–17.0)
Immature Granulocytes: 0 %
Lymphocytes Relative: 38 %
Lymphs Abs: 2.1 10*3/uL (ref 0.7–4.0)
MCH: 29.5 pg (ref 26.0–34.0)
MCHC: 33.4 g/dL (ref 30.0–36.0)
MCV: 88.2 fL (ref 80.0–100.0)
Monocytes Absolute: 0.5 10*3/uL (ref 0.1–1.0)
Monocytes Relative: 8 %
Neutro Abs: 2.9 10*3/uL (ref 1.7–7.7)
Neutrophils Relative %: 53 %
Platelets: 232 10*3/uL (ref 150–400)
RBC: 4.48 MIL/uL (ref 4.22–5.81)
RDW: 12.8 % (ref 11.5–15.5)
WBC: 5.6 10*3/uL (ref 4.0–10.5)
nRBC: 0 % (ref 0.0–0.2)

## 2023-06-24 LAB — COMPREHENSIVE METABOLIC PANEL
ALT: 12 U/L (ref 0–44)
AST: 18 U/L (ref 15–41)
Albumin: 3.7 g/dL (ref 3.5–5.0)
Alkaline Phosphatase: 63 U/L (ref 38–126)
Anion gap: 6 (ref 5–15)
BUN: 10 mg/dL (ref 6–20)
CO2: 28 mmol/L (ref 22–32)
Calcium: 9 mg/dL (ref 8.9–10.3)
Chloride: 103 mmol/L (ref 98–111)
Creatinine, Ser: 1.21 mg/dL (ref 0.61–1.24)
GFR, Estimated: >60 mL/min (ref >60)
Glucose, Bld: 91 mg/dL (ref 70–99)
Potassium: 3.7 mmol/L (ref 3.5–5.1)
Sodium: 137 mmol/L (ref 135–145)
Total Bilirubin: 0.6 mg/dL (ref 0.3–1.2)
Total Protein: 6.7 g/dL (ref 6.5–8.1)

## 2023-06-24 LAB — SYNOVIAL CELL COUNT + DIFF, W/ CRYSTALS
Crystals, Fluid: NONE SEEN
Eosinophils-Synovial: 0 %
Lymphocytes-Synovial Fld: 3 %
Monocyte-Macrophage-Synovial Fluid: 7 %
Neutrophil, Synovial: 90 %
WBC, Synovial: 29121 /mm3 — ABNORMAL HIGH (ref 0–200)

## 2023-06-24 MED ORDER — OXYCODONE-ACETAMINOPHEN 5-325 MG PO TABS
1.0000 | ORAL_TABLET | ORAL | 0 refills | Status: AC | PRN
Start: 2023-06-24 — End: 2024-06-23

## 2023-06-24 MED ORDER — OXYCODONE-ACETAMINOPHEN 5-325 MG PO TABS
1.0000 | ORAL_TABLET | Freq: Once | ORAL | Status: AC
  Administered 2023-06-24: 1 via ORAL
  Filled 2023-06-24: qty 1

## 2023-06-24 MED ORDER — LIDOCAINE HCL (PF) 1 % IJ SOLN
5.0000 mL | Freq: Once | INTRAMUSCULAR | Status: AC
  Administered 2023-06-24: 5 mL
  Filled 2023-06-24: qty 5

## 2023-06-24 MED ORDER — SULFAMETHOXAZOLE-TRIMETHOPRIM 800-160 MG PO TABS: 1.0000 | ORAL_TABLET | Freq: Two times a day (BID) | ORAL | 0 refills | Status: AC

## 2023-06-24 MED ORDER — CEFTRIAXONE SODIUM 1 G IJ SOLR
1.0000 g | Freq: Once | INTRAMUSCULAR | Status: AC
  Administered 2023-06-24: 1 g via INTRAMUSCULAR
  Filled 2023-06-24: qty 10

## 2023-06-24 NOTE — ED Provider Notes (Signed)
-----------------------------------------   3:26 PM on 06/24/2023 ----------------------------------------- I have seen the patient in conjunction with physician assistant Cruz Condon.  Patient has right elbow swelling, no trauma.  X-ray appears to show a joint effusion.  Examination is much more consistent with an actual joint effusion than it is with a bursitis.  No history of gout.  No trauma.  Patient does state a recent upper respiratory infection a week or 2 ago but that has since cleared.  Given the nontraumatic swelling and significant discomfort and pain of the right elbow we will obtain synovial fluid with a right elbow arthrocentesis to evaluate for gout, synovitis, septic arthritis.  We will also check basic labs and continue to closely monitor.  [name] synovial cell count shows 29,000 white blood cells that is 90% neutrophils.  We spoke with Dr. Valentina Shaggy and of orthopedics given no fever or systemic symptoms normal white blood cell count he recommends a dose of antibiotics in the emergency department discharged on antibiotics and they will follow-up in the office.  He will follow-up on the culture as well.  Patient agreeable to plan of care.   Andrew Antis, MD 06/24/23 1807

## 2023-06-24 NOTE — ED Provider Notes (Signed)
[facility] Provider Note    Event Date/Time   First MD Initiated Contact with Patient 06/24/23 1223     (approximate)   History   Elbow Pain   HPI  Andrew Sutton is a 52 y.o. male with PMH of chron's disease, arthritis, GERD and cannabis abuse presents for evaluation of right elbow pain.  Patient states that yesterday his elbow started to feel kind of funny and this morning when he woke up he had severe pain when he tried to take the blankets off of him.  He has noticed it is swollen and he is unable to bend and extend his arm.  He denies any falls or specific injuries to the elbow.  No history of gout, no IV drug use and no recent STDs or STIs.      Physical Exam   Triage Vital Signs: ED Triage Vitals  Encounter Vitals Group     BP 06/24/23 1158 113/76     Systolic BP Percentile --      Diastolic BP Percentile --      Pulse Rate 06/24/23 1158 77     Resp 06/24/23 1158 18     Temp 06/24/23 1158 98.4 F (36.9 C)     Temp Source 06/24/23 1158 Oral     SpO2 06/24/23 1158 98 %     Weight 06/24/23 1159 176 lb (79.8 kg)     Height 06/24/23 1159 6\' 3"  (1.905 m)     Head Circumference --      Peak Flow --      Pain Score 06/24/23 1159 9     Pain Loc --      Pain Education --      Exclude from Growth Chart --     Most recent vital signs: Vitals:   06/24/23 1520 06/24/23 1831  BP: 118/70 120/70  Pulse: 70 70  Resp: 18 18  Temp:  98 F (36.7 C)  SpO2: 98% 98%     General: Awake, no distress.  CV:  Good peripheral perfusion.  Resp:  Normal effort. Abd:  No distention.  Other:  Right elbow swollen when compared to the left, no overlying skin changes or bruising, ROM limited due to pain, TTP over the posterior elbow, radial pulse 2+ and regular, sensation intact across all dermatomes.   ED Results / Procedures / Treatments   Labs (all labs ordered are listed, but only abnormal results are displayed) Labs Reviewed  SYNOVIAL CELL COUNT +  DIFF, W/ CRYSTALS - Abnormal; Notable for the following components:      Result Value   Color, Synovial YELLOW (*)    Appearance-Synovial CLOUDY (*)    WBC, Synovial 29,121 (*)    All other components within normal limits  BODY FLUID CULTURE W GRAM STAIN  COMPREHENSIVE METABOLIC PANEL  CBC WITH DIFFERENTIAL/PLATELET     RADIOLOGY  Right elbow xrays obtained, I interpreted the images as well as reviewed the radiologist report. There is evidence of joint effusion.   PROCEDURES:  Critical Care performed: No  Procedures   MEDICATIONS ORDERED IN ED: Medications  oxyCODONE-acetaminophen (PERCOCET/ROXICET) 5-325 MG per tablet 1 tablet (1 tablet Oral Given 06/24/23 1310)  lidocaine (PF) (XYLOCAINE) 1 % injection 5 mL (5 mLs Infiltration Given by Other 06/24/23 1520)  oxyCODONE-acetaminophen (PERCOCET/ROXICET) 5-325 MG per tablet 1 tablet (1 tablet Oral Given 06/24/23 1741)  cefTRIAXone (ROCEPHIN) injection 1 g (1 g Intramuscular Given 06/24/23 1819)  lidocaine (PF) (XYLOCAINE) 1 %  injection 5 mL (5 mLs Other Given 06/24/23 1819)     IMPRESSION / MDM / ASSESSMENT AND PLAN / ED COURSE  I reviewed the triage vital signs and the nursing notes.                             52 year old male presents for evaluation of right elbow pain. VSS in triage and patient NAD on exam.   Differential diagnosis includes, but is not limited to, elbow fracture, elbow dislocation, arthritis, gout, septic joint.  [name] presentation is most consistent with acute complicated illness / injury requiring diagnostic workup.  Right elbow xray obtained, I interpreted the images as well as reviewed the radiologist report which showed a joint effusion.  I discussed these findings with my attending physician, who performed a joint aspiration.  Fluid was sent for evaluation.  Synovial cell count shows 29,000 WBCs with 90% neutrophils.  Culture is pending.  CBC and CMP unremarkable.  I spoke with the on-call  orthopedic provider, Dr. Audelia Acton regarding patient's synovial cell count.  He confirmed that the joint effusion is likely inflammatory as the WBC count is not above 50,000, however he said this is not a hard and fast rule and recommended antibiotics.  Patient was given a dose of Rocephin while in the ED and I will send him home with a prescription for Bactrim.  Dr. Audelia Acton agreed to see the patient in the office later this week.  Patient was advised to return to the ED if he has any worsening symptoms or develops fever and chills.  He voiced understanding, all questions were answered and he was stable at discharge.      FINAL CLINICAL IMPRESSION(S) / ED DIAGNOSES   Final diagnoses:  Elbow joint effusion, right     Rx / DC Orders   ED Discharge Orders          Ordered    sulfamethoxazole-trimethoprim (BACTRIM DS) 800-160 MG tablet  2 times daily        06/24/23 1815    oxyCODONE-acetaminophen (PERCOCET) 5-325 MG tablet  Every 4 hours PRN        06/24/23 1831             Note:  This document was prepared using Dragon voice recognition software and may include unintentional dictation errors.   Cameron Ali, PA-C 06/24/23 Lina Sar, MD 06/26/23 [phone]

## 2023-06-24 NOTE — ED Triage Notes (Signed)
Patient to ED via POV for right elbow pain. Pt reports feeling a slight pain in it yesterday but today noticed extreme pain when pulling back the covers off the bed. Swelling noted.

## 2023-06-24 NOTE — Discharge Instructions (Signed)
Please take the antibiotics as prescribed. Call Dr. Ellene Route office, who's information is attached to schedule an appointment for later this week.   If your symptoms get worse, or you develop a fever and chills, please return to the ED. I recommend that you go to [facility] or [facility] as they have access to an orthopedic specialist that would be able to clean out your joint.

## 2023-06-24 NOTE — ED Notes (Signed)
Pt medicated for pain 9/10

## 2023-06-24 NOTE — ED Notes (Addendum)
See triage note  Presents with pain and swelling to right elbow  States woke up with this more swelling  Did notice some pain yesterday

## 2023-06-28 LAB — BODY FLUID CULTURE W GRAM STAIN: Culture: NO GROWTH

## 2023-07-28 IMAGING — MR MR ANKLE*L* W/O CM
4 of 5 series · 12 of 40 positions shown · non-contrast
Comparison: Ankle radiograph 11/02/2021

CLINICAL DATA: Ankle pain, instability suspected, neg xray Left
ankle instability

EXAM:
MRI OF THE LEFT ANKLE WITHOUT CONTRAST
TECHNIQUE: Multiplanar, multisequence MR imaging of the ankle was performed. No
intravenous contrast was administered.

[Series 3: PD fat-sat · axial · left · 3.0mm · 0.25mm/px · z∈[-42,+65]mm · 3 of 35 slices shown]
[im 4/35]
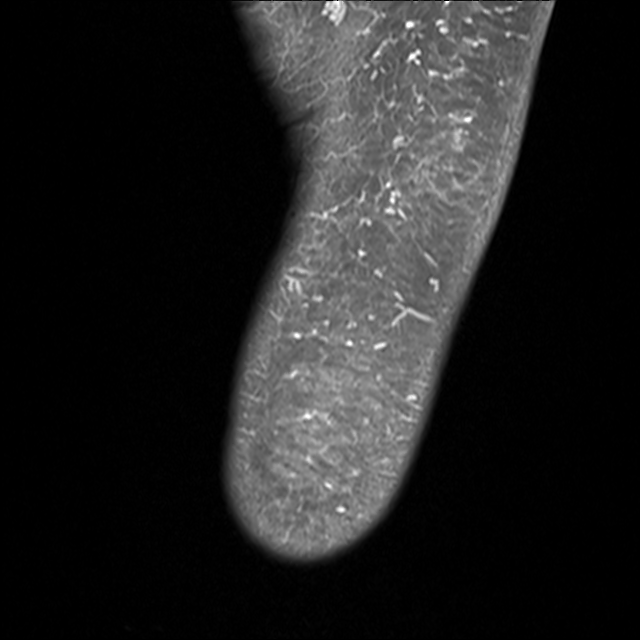
[im 19/35]
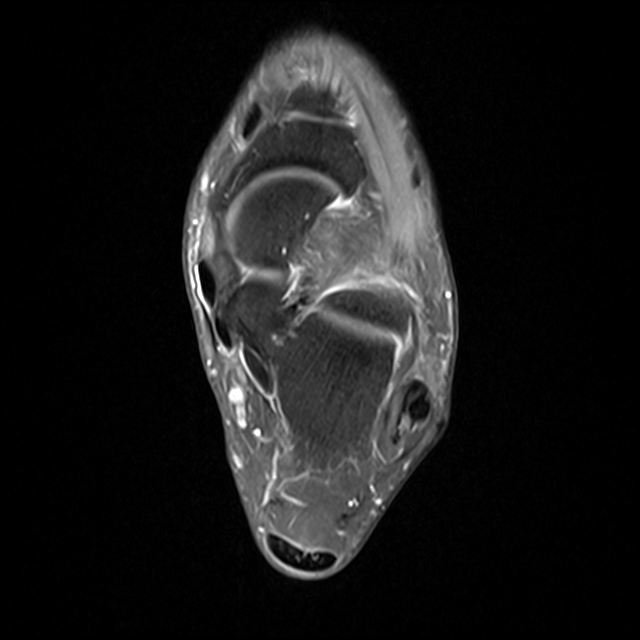
[im 31/35]
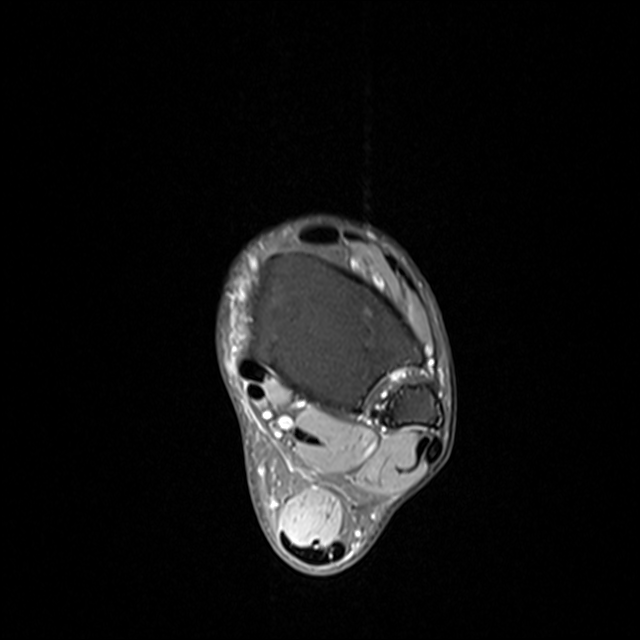

[Series 4: T2 fat-sat · axial · left · 3.0mm · 0.25mm/px · z∈[-38,+61]mm · 3 of 35 slices shown (1 of 2)]
[im 5/35]
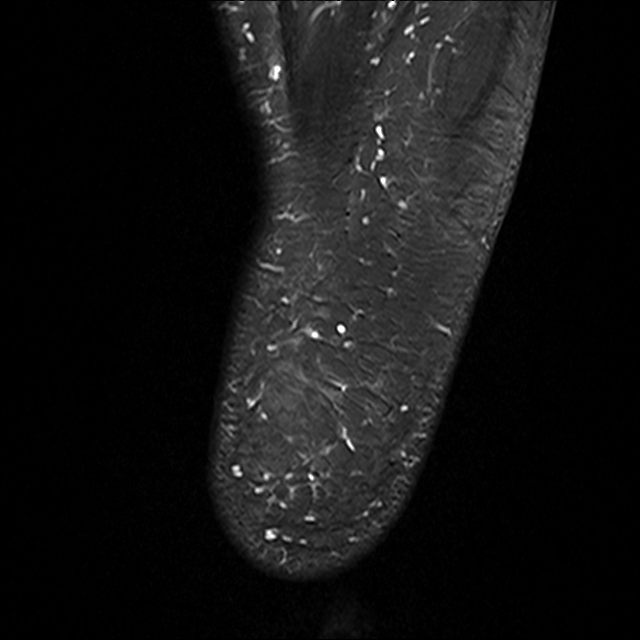
[im 18/35]
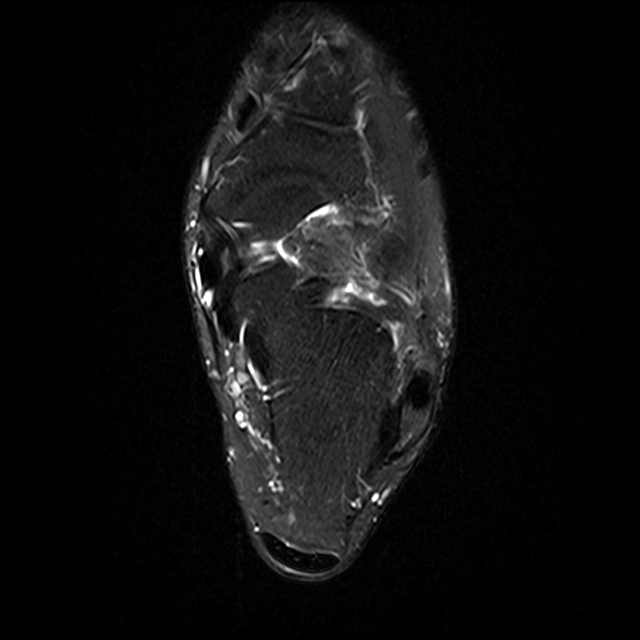
[im 30/35]
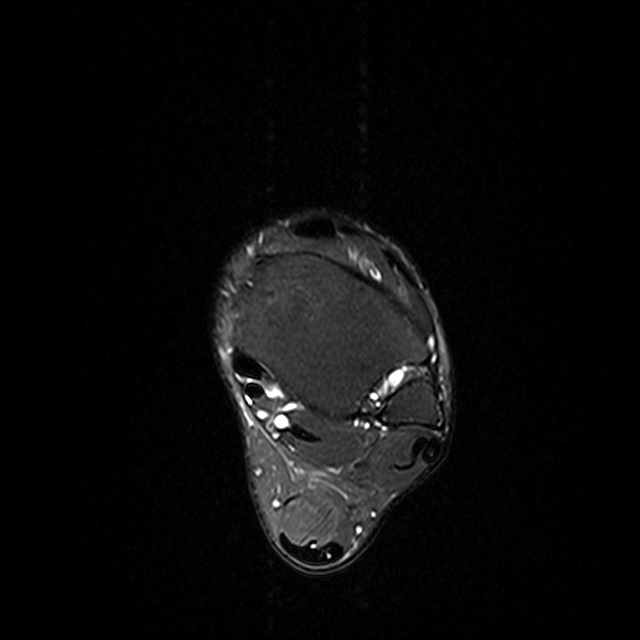

[Series 5: T1 · sagittal · left · 4.0mm · 0.27mm/px · 3 of 24 slices shown]
[im 5/24]
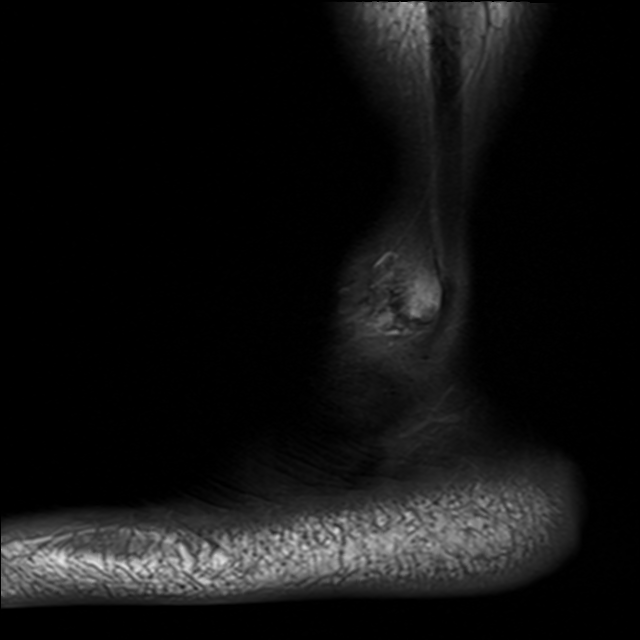
[im 14/24]
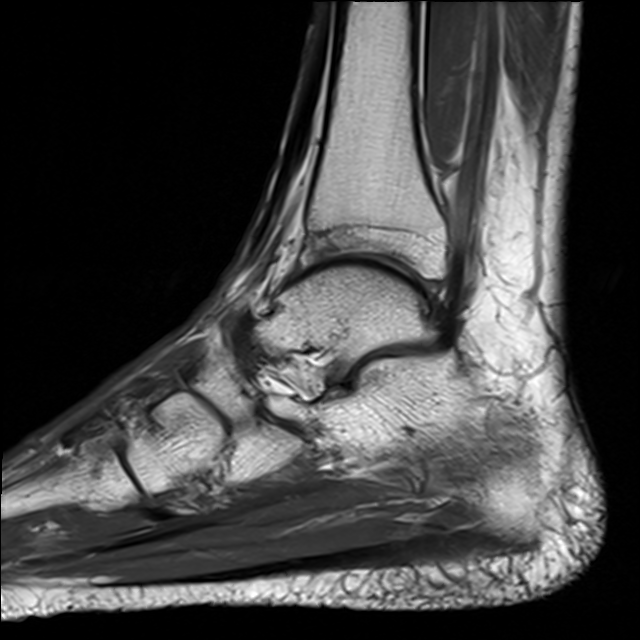
[im 24/24]
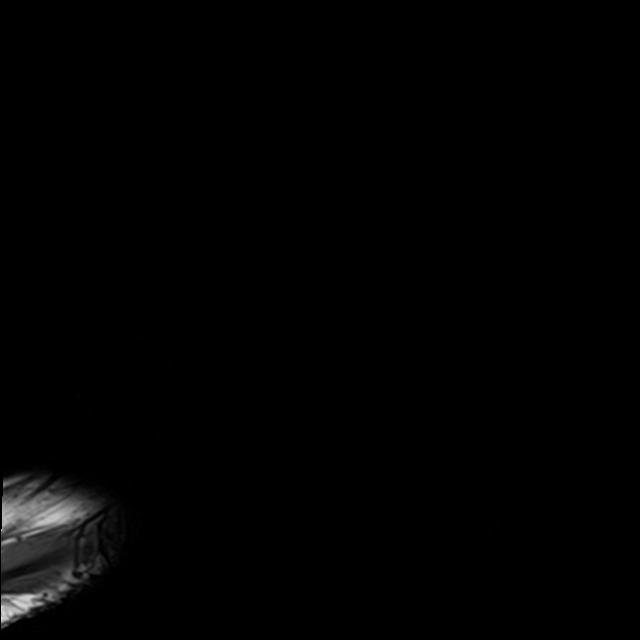

[Series 7: T2 fat-sat · coronal · left · 3.0mm · 0.25mm/px · 3 of 35 slices shown (2 of 2)]
[im 5/35]
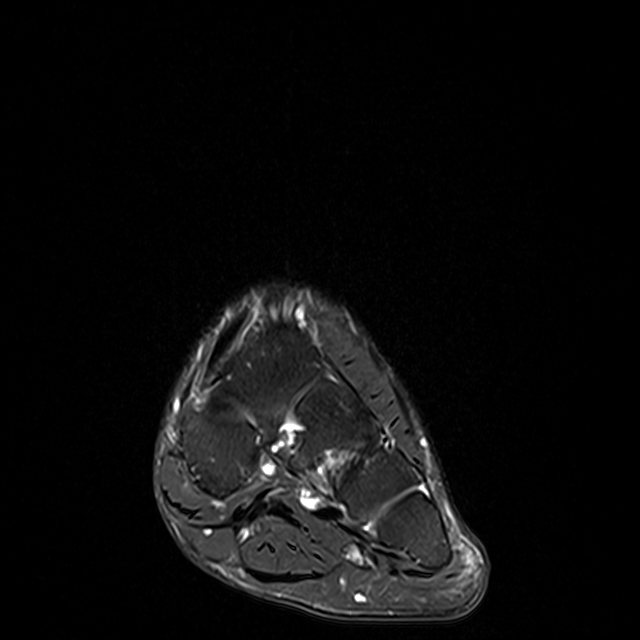
[im 18/35]
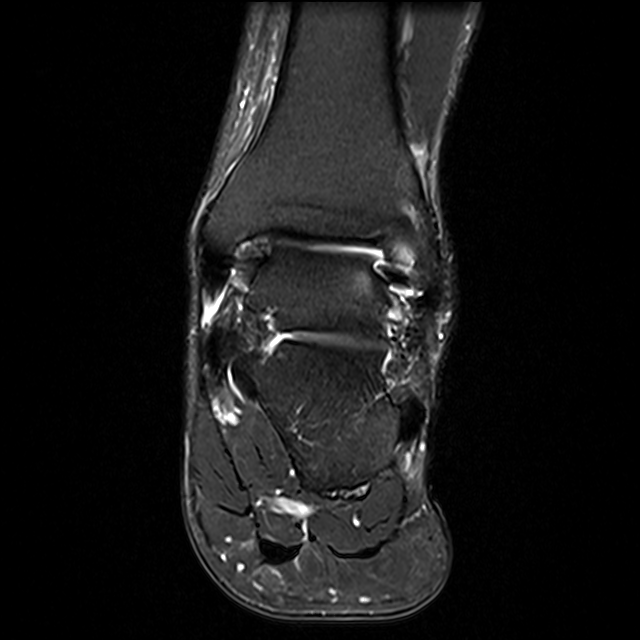
[im 30/35]
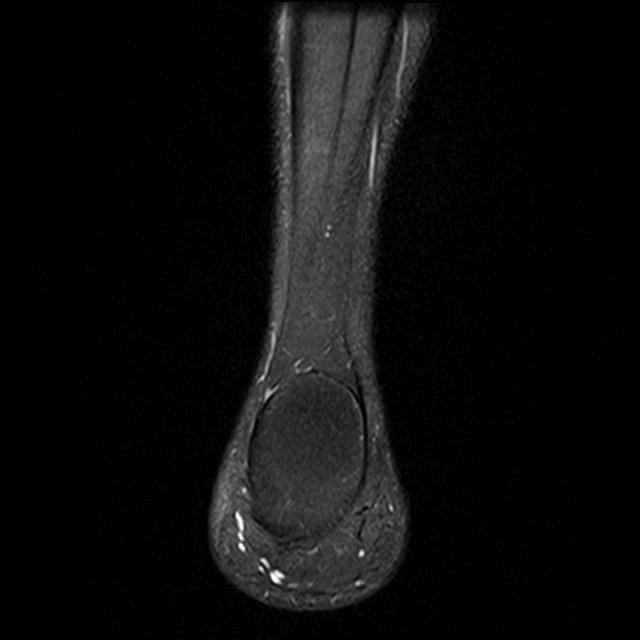

[12 of 40 positions shown; findings below may reference images not displayed]

FINDINGS: TENDONS

Peroneal: Intact peroneus longus and peroneus brevis tendons.

Posteromedial: Minimal tenosynovitis of the posterior tibial and
flexor digitorum tendon just below the medial malleolus. Intact
flexor hallucis longus tendon.

Anterior: Intact tibialis anterior, extensor hallucis longus and
extensor digitorum longus tendons.

Achilles: Intact.

Plantar Fascia: There is focal thickening of the central bundle
plantar fascia proximally but no increased signal, likely reflecting
chronic plantar fasciitis.

LIGAMENTS

Lateral: Prior ATFL repair with anchor in the distal fibula. The
postoperative ATFL appears intact with scarring along the proximal
aspect. Chronic calcaneofibular ligament sprain. Posterior
talofibular ligament is intact. The anterior posterior tibiofibular
ligaments are intact.

Medial: Deltoid ligament intact. Spring ligament intact.

CARTILAGE

Ankle Joint: Trace tibiotalar joint fluid. There is medial
predominant tibiotalar osteoarthritis with high-grade cartilage loss
and underlying subchondral marrow edema. There is medial joint space
loss with bony spurring and marrow edema in the medial malleolus and
adjacent medial talus.

Subtalar Joints/Sinus Tarsi: Normal subtalar joints. No subtalar
joint effusion. Normal sinus tarsi.

Bones: No acute fracture or dislocation. No aggressive osseous
lesion.

Soft Tissue: No fluid collection or hematoma. Muscles are normal
without edema or atrophy. Tarsal tunnel is normal.
IMPRESSION: Prior ATFL repair with anchor in the distal fibula. Intact
postoperative ATFL with scarring along the proximal aspect. Chronic
calcaneofibular ligament sprain.

Medial predominant tibiotalar osteoarthritis with high-grade
cartilage loss and underlying associated subchondral marrow edema.

Findings of anteromedial ankle impingement with bony spurring and
marrow edema in the medial malleolus and adjacent medial talus.

Minimal tenosynovitis of the posterior tibial and flexor digitorum
tendons just below the medial malleolus.

## 2023-08-04 ENCOUNTER — Other Ambulatory Visit: Payer: Self-pay

## 2023-08-04 ENCOUNTER — Emergency Department
Admission: EM | Admit: 2023-08-04 | Discharge: 2023-08-04 | Disposition: A | Discharge status: Home / Self Care | Payer: BC Managed Care – PPO | Attending: Emergency Medicine | Admitting: Emergency Medicine

## 2023-08-04 DIAGNOSIS — R3989 Other symptoms and signs involving the genitourinary system: Secondary | ICD-10-CM

## 2023-08-04 DIAGNOSIS — N369 Urethral disorder, unspecified: Secondary | ICD-10-CM | POA: Insufficient documentation

## 2023-08-04 LAB — CHLAMYDIA/NGC RT PCR (ARMC ONLY)
Chlamydia Tr: NOT DETECTED
N gonorrhoeae: NOT DETECTED
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
## 2023-08-04 NOTE — Discharge Instructions (Signed)
Your gonorrhea and Chlamydia test were negative.  Please follow-up with your regular provider.

## 2023-08-24 ENCOUNTER — Emergency Department: Payer: BC Managed Care – PPO

## 2023-08-24 ENCOUNTER — Other Ambulatory Visit: Payer: Self-pay

## 2023-08-24 DIAGNOSIS — Z20822 Contact with and (suspected) exposure to covid-19: Secondary | ICD-10-CM | POA: Insufficient documentation

## 2023-08-24 DIAGNOSIS — J40 Bronchitis, not specified as acute or chronic: Secondary | ICD-10-CM | POA: Insufficient documentation

## 2023-08-24 DIAGNOSIS — R0602 Shortness of breath: Secondary | ICD-10-CM | POA: Diagnosis present

## 2023-08-24 NOTE — ED Triage Notes (Signed)
Pt presents to ER with c/o sob, chills, and cough for the last few days.  Denies any known sick contacts.  Pt is concerned for walking pna om arrival.  Pt is otherwise A&O x4 and in NAD at this time.

## 2023-08-24 NOTE — ED Provider Notes (Signed)
   [facility] Provider Note    Event Date/Time   First MD Initiated Contact with Patient 08/24/23 2347     (approximate)   History   Shortness of Breath   HPI  Andrew Sutton is a 52 y.o. male who presents to the ED for evaluation of Shortness of Breath      Physical Exam   Triage Vital Signs: ED Triage Vitals [08/24/23 2347]  Encounter Vitals Group     BP 112/77     Systolic BP Percentile      Diastolic BP Percentile      Pulse Rate 71     Resp 20     Temp 98 F (36.7 C)     Temp Source Oral     SpO2 100 %     Weight      Height      Head Circumference      Peak Flow      Pain Score 0     Pain Loc      Pain Education      Exclude from Growth Chart     Most recent vital signs: Vitals:   08/24/23 2347  BP: 112/77  Pulse: 71  Resp: 20  Temp: 98 F (36.7 C)  SpO2: 100%    General: Awake, no distress. *** CV:  Good peripheral perfusion.  Resp:  Normal effort.  Abd:  No distention.  MSK:  No deformity noted.  Neuro:  No focal deficits appreciated. Other:     ED Results / Procedures / Treatments   Labs (all labs ordered are listed, but only abnormal results are displayed) Labs Reviewed - No data to display  EKG ***  RADIOLOGY ***  Official radiology report(s): No results found.  PROCEDURES and INTERVENTIONS:  Procedures  Medications - No data to display   IMPRESSION / MDM / ASSESSMENT AND PLAN / ED COURSE  I reviewed the triage vital signs and the nursing notes.  Differential diagnosis includes, but is not limited to, ***  {Patient presents with symptoms of an acute illness or injury that is potentially life-threatening.}      FINAL CLINICAL IMPRESSION(S) / ED DIAGNOSES   Final diagnoses:  None     Rx / DC Orders   ED Discharge Orders     None        Note:  This document was prepared using Dragon voice recognition software and may include unintentional dictation errors.

## 2023-08-25 ENCOUNTER — Emergency Department
Admission: EM | Admit: 2023-08-25 | Discharge: 2023-08-25 | Disposition: A | Discharge status: Home / Self Care | Payer: BC Managed Care – PPO | Attending: Emergency Medicine | Admitting: Emergency Medicine

## 2023-08-25 DIAGNOSIS — R051 Acute cough: Secondary | ICD-10-CM

## 2023-08-25 DIAGNOSIS — J40 Bronchitis, not specified as acute or chronic: Secondary | ICD-10-CM

## 2023-08-25 DIAGNOSIS — R0789 Other chest pain: Secondary | ICD-10-CM

## 2023-08-25 LAB — RESP PANEL BY RT-PCR (RSV, FLU A&B, COVID)  RVPGX2
Influenza A by PCR: NEGATIVE
Influenza B by PCR: NEGATIVE
Resp Syncytial Virus by PCR: NEGATIVE
SARS Coronavirus 2 by RT PCR: NEGATIVE

## 2023-08-25 NOTE — Discharge Instructions (Addendum)
Please take Tylenol and ibuprofen/Advil for your pain.  It is safe to take them together, or to alternate them every few hours.  Take up to 1000mg of Tylenol at a time, up to 4 times per day.  Do not take more than 4000 mg of Tylenol in 24 hours.  For ibuprofen, take 400-600 mg, 3 - 4 times per day.  

## 2024-03-03 ENCOUNTER — Emergency Department
Admission: EM | Admit: 2024-03-03 | Discharge: 2024-03-03 | Disposition: A | Discharge status: Home / Self Care | Attending: Emergency Medicine | Admitting: Emergency Medicine

## 2024-03-03 ENCOUNTER — Other Ambulatory Visit: Payer: Self-pay

## 2024-03-03 DIAGNOSIS — M461 Sacroiliitis, not elsewhere classified: Secondary | ICD-10-CM | POA: Insufficient documentation

## 2024-03-03 DIAGNOSIS — M545 Low back pain, unspecified: Secondary | ICD-10-CM | POA: Diagnosis present

## 2024-03-03 MED ORDER — LIDOCAINE 5 % EX PTCH
1.0000 | MEDICATED_PATCH | CUTANEOUS | Status: DC
  Administered 2024-03-03: 1 via TRANSDERMAL
  Filled 2024-03-03: qty 1

## 2024-03-03 MED ORDER — LIDOCAINE 5 % EX PTCH: 1.0000 | MEDICATED_PATCH | Freq: Two times a day (BID) | CUTANEOUS | 0 refills | Status: AC

## 2024-03-03 NOTE — Discharge Instructions (Signed)
 We believe that your symptoms are caused by musculoskeletal strain.  Please read through the included information about additional care such as heating pads, over-the-counter pain medicine.  If you were provided a prescription please use it only as needed and as instructed.  Remember that early mobility and using the affected part of your body is actually better than keeping it immobile.  If you use lidocaine  patches, please do not use heating pads while the medication is on your skin.  Follow-up with the doctor listed as recommended or return to the emergency department with new or worsening symptoms that concern you.

## 2024-03-03 NOTE — ED Triage Notes (Signed)
 Pt to ED via POV c/o lower back pain since Thursday. Has been getting progressively worse. Went to [facility] yesterday and was prescribed meloxicam  but hasn't been able to pick it up.

## 2024-03-03 NOTE — ED Provider Notes (Signed)
 [facility] Provider Note    Event Date/Time   First MD Initiated Contact with Patient 03/03/24 [phone]     (approximate)   History   Back Pain   HPI Andrew Sutton is a 53 y.o. male who presents for evaluation of 4 to 5 days of pain on both sides of his lower back as well as in the middle.  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  The pain is worse with ambulation and with movement such as those done through his work duties.  He has had no numbness, tingling, nor weakness in his legs.  No numbness or tingling in his groin or pelvis.  No loss of bowel or bladder control and no urinary retention.  No recent trauma other than the repetitive motion.  No neck pain.     Physical Exam   Triage Vital Signs: ED Triage Vitals  Encounter Vitals Group     BP 03/03/24 0414 111/78     Girls Systolic BP Percentile --      Girls Diastolic BP Percentile --      Boys Systolic BP Percentile --      Boys Diastolic BP Percentile --      Pulse Rate 03/03/24 0414 78     Resp 03/03/24 0414 16     Temp 03/03/24 0414 98.1 F (36.7 C)     Temp Source 03/03/24 0414 Oral     SpO2 03/03/24 0414 100 %     Weight 03/03/24 0413 77.1 kg (170 lb)     Height 03/03/24 0413 1.905 m (6' 3)     Head Circumference --      Peak Flow --      Pain Score 03/03/24 0413 8     Pain Loc --      Pain Education --      Exclude from Growth Chart --     Most recent vital signs: Vitals:   03/03/24 0414  BP: 111/78  Pulse: 78  Resp: 16  Temp: 98.1 F (36.7 C)  SpO2: 100%    General: Awake, no obvious distress, appears uncomfortable when moving around. CV:  Good peripheral perfusion.  Resp:  Normal effort. Speaking easily and comfortably, no accessory muscle usage nor intercostal retractions.   Abd:  No distention.  Other:  Easily reproducible tenderness to manipulation of bilateral iliac crests and palpation of the soft tissues of  the lumbar region of his spine.  No palpable deformities along the spine itself.  Patient is able to ambulate with no apparent difficulty although it appears somewhat uncomfortable.   ED Results / Procedures / Treatments   Labs (all labs ordered are listed, but only abnormal results are displayed) Labs Reviewed - No data to display    PROCEDURES:  Critical Care performed: No  Procedures    IMPRESSION / MDM / ASSESSMENT AND PLAN / ED COURSE  I reviewed the triage vital signs and the nursing notes.                              Differential diagnosis includes, but is not limited to, sacroiliitis, nonspecific musculoskeletal strain, lumbar strain, cauda equina syndrome, osteomyelitis, epidural abscess.  [name] presentation is most consistent with acute, uncomplicated illness.  Interventions/Medications given:  Medications  lidocaine  (LIDODERM ) 5 % 1 patch (has no administration in time range)  lidocaine  (LIDODERM ) 5 % 1 patch (has no administration in time range)    (Note:  hospital course my include additional interventions and/or labs/studies not listed above.)   I reviewed the medical record including the patient's visit yesterday to urgent care where he was given a shot of Toradol  and a prescription for meloxicam  that he said he has not yet been able to pick up.  I agree with prior examination, his presentation is consistent with sacroiliitis.  No evidence of an emergent cause of back pain requiring imaging.  I reinforced the recommendations and information about sacroiliitis given to the patient previously, provided Lidoderm  patches and prescription, and encouraged stretching and exercises to return to work within several days.  Also provided follow-up information with orthopedics should he choose to follow-up.  I gave my usual and customary return precautions.         FINAL CLINICAL IMPRESSION(S) / ED DIAGNOSES   Final diagnoses:  Bilateral sacroiliitis (HCC)      Rx / DC Orders   ED Discharge Orders          Ordered    lidocaine  (LIDODERM ) 5 %  Every 12 hours        03/03/24 0709             Note:  This document was prepared using Dragon voice recognition software and may include unintentional dictation errors.   Gordan Huxley, MD 03/03/24 [phone]

## 2024-05-04 ENCOUNTER — Emergency Department
Admission: EM | Admit: 2024-05-04 | Discharge: 2024-05-04 | Disposition: A | Discharge status: Home / Self Care | Attending: Emergency Medicine | Admitting: Emergency Medicine

## 2024-05-04 ENCOUNTER — Emergency Department

## 2024-05-04 DIAGNOSIS — R059 Cough, unspecified: Secondary | ICD-10-CM | POA: Diagnosis present

## 2024-05-04 DIAGNOSIS — B349 Viral infection, unspecified: Secondary | ICD-10-CM | POA: Insufficient documentation

## 2024-05-04 DIAGNOSIS — D72819 Decreased white blood cell count, unspecified: Secondary | ICD-10-CM | POA: Diagnosis not present

## 2024-05-04 DIAGNOSIS — J069 Acute upper respiratory infection, unspecified: Secondary | ICD-10-CM | POA: Diagnosis not present

## 2024-05-04 DIAGNOSIS — R079 Chest pain, unspecified: Secondary | ICD-10-CM

## 2024-05-04 DIAGNOSIS — R0602 Shortness of breath: Secondary | ICD-10-CM

## 2024-05-04 LAB — BASIC METABOLIC PANEL WITH GFR
Anion gap: 9 (ref 5–15)
BUN: 10 mg/dL (ref 6–20)
CO2: 25 mmol/L (ref 22–32)
Calcium: 9.1 mg/dL (ref 8.9–10.3)
Chloride: 104 mmol/L (ref 98–111)
Creatinine, Ser: 1.33 mg/dL — ABNORMAL HIGH (ref 0.61–1.24)
GFR, Estimated: >60 mL/min (ref >60)
Glucose, Bld: 122 mg/dL — ABNORMAL HIGH (ref 70–99)
Potassium: 3.6 mmol/L (ref 3.5–5.1)
Sodium: 138 mmol/L (ref 135–145)

## 2024-05-04 LAB — CBC
HCT: 41.8 % (ref 39.0–52.0)
Hemoglobin: 13.9 g/dL (ref 13.0–17.0)
MCH: 29.4 pg (ref 26.0–34.0)
MCHC: 33.3 g/dL (ref 30.0–36.0)
MCV: 88.6 fL (ref 80.0–100.0)
Platelets: 187 K/uL (ref 150–400)
RBC: 4.72 MIL/uL (ref 4.22–5.81)
RDW: 13.6 % (ref 11.5–15.5)
WBC: 2.7 K/uL — ABNORMAL LOW (ref 4.0–10.5)
nRBC: 0 % (ref 0.0–0.2)

## 2024-05-04 LAB — TROPONIN I (HIGH SENSITIVITY): Troponin I (High Sensitivity): 3 ng/L (ref <18)

## 2024-05-04 LAB — RESP PANEL BY RT-PCR (RSV, FLU A&B, COVID)  RVPGX2
Influenza A by PCR: NEGATIVE
Influenza B by PCR: NEGATIVE
Resp Syncytial Virus by PCR: NEGATIVE
SARS Coronavirus 2 by RT PCR: NEGATIVE

## 2024-05-04 NOTE — ED Triage Notes (Signed)
 PT complains of cold chills and body aches that started last night. Woke up sweating this morning.

## 2024-05-04 NOTE — ED Provider Notes (Signed)
 [facility] Provider Note   Event Date/Time   First MD Initiated Contact with Patient 05/04/24 1326     (approximate) History  Chills and Generalized Body Aches  HPI Andrew Sutton is a 53 y.o. male with stated past medical history of heart murmur, GERD, and Crohn's disease who presents complaining of chest pain and shortness of breath in the setting of bodyaches, chills, cough, and sore throat that has been present over the last day.  Patient states that his wife and daughter have both been sick in the house recently with similar symptoms.  Patient states that he wanted to get checked out because I found out I have a bad heart and want to make sure I am okay.  Patient states he was recently diagnosed with an abnormal heart rhythm and is concerned as this runs in his family. ROS: Patient currently denies any vision changes, tinnitus, difficulty speaking, facial droop, abdominal pain, nausea/vomiting/diarrhea, dysuria, or numbness/paresthesias in any extremity   Physical Exam  Triage Vital Signs: ED Triage Vitals  Encounter Vitals Group     BP 05/04/24 1227 (!) 134/107     Girls Systolic BP Percentile --      Girls Diastolic BP Percentile --      Boys Systolic BP Percentile --      Boys Diastolic BP Percentile --      Pulse Rate 05/04/24 1227 81     Resp 05/04/24 1227 20     Temp 05/04/24 1227 99.4 F (37.4 C)     Temp Source 05/04/24 1227 Oral     SpO2 05/04/24 1227 100 %     Weight --      Height --      Head Circumference --      Peak Flow --      Pain Score 05/04/24 1328 0     Pain Loc --      Pain Education --      Exclude from Growth Chart --    Most recent vital signs: Vitals:   05/04/24 1227  BP: (!) 134/107  Pulse: 81  Resp: 20  Temp: 99.4 F (37.4 C)  SpO2: 100%   General: Awake, oriented x4. CV:  Good peripheral perfusion.  No MGR Resp:  Normal effort.  CTAB Abd:  No distention. Other:  Middle-aged well-developed, well-nourished  African-American male resting comfortably in no acute distress ED Results / Procedures / Treatments  Labs (all labs ordered are listed, but only abnormal results are displayed) Labs Reviewed  BASIC METABOLIC PANEL WITH GFR - Abnormal; Notable for the following components:      Result Value   Glucose, Bld 122 (*)    Creatinine, Ser 1.33 (*)    All other components within normal limits  CBC - Abnormal; Notable for the following components:   WBC 2.7 (*)    All other components within normal limits  RESP PANEL BY RT-PCR (RSV, FLU A&B, COVID)  RVPGX2  TROPONIN I (HIGH SENSITIVITY)   EKG ED ECG REPORT I, Artist MARLA Kerns, the attending physician, personally viewed and interpreted this ECG. Date: 05/04/2024 EKG Time: 1233 Rate: 81 Rhythm: normal sinus rhythm QRS Axis: normal Intervals: normal ST/T Wave abnormalities: normal Narrative Interpretation: no evidence of acute ischemia RADIOLOGY ED MD interpretation: 2 view chest x-ray interpreted by me shows no evidence of acute abnormalities including no pneumonia, pneumothorax, or widened mediastinum - All radiology independently interpreted and agree with radiology assessment Official radiology report(s): DG  Chest 2 View Result Date: 05/04/2024 CLINICAL DATA:  Chest pain. EXAM: CHEST - 2 VIEW COMPARISON:  08/25/2023. FINDINGS: Bilateral lung fields are clear. Bilateral costophrenic angles are clear. Normal cardio-mediastinal silhouette. No acute osseous abnormalities. The soft tissues are within normal limits. IMPRESSION: No active cardiopulmonary disease. Electronically Signed   By: Ree Molt M.D.   On: 05/04/2024 13:20   PROCEDURES: Critical Care performed: No Procedures MEDICATIONS ORDERED IN ED: Medications - No data to display IMPRESSION / MDM / ASSESSMENT AND PLAN / ED COURSE  I reviewed the triage vital signs and the nursing notes.                             The patient is on the cardiac monitor to evaluate for evidence of  arrhythmia and/or significant heart rate changes. [name] presentation is most consistent with acute presentation with potential threat to life or bodily function. [name] 53 year old male with the above-stated past medical history who presents complaining of shortness of breath and chest pain in the setting of viral symptoms including generalized bodyaches, subjective fever, sore throat, and cough. DDx: ACS, pneumonia, pneumothorax, arrhythmia, URI Plan: CBC significant for leukopenia to 2.7 BMP with creatinine 1.33 Troponin negative RVP negative Chest x-ray negative EKG nonischemic  Patient given the results of this laboratory and radiologic evaluation.  There is no red flag symptomatology found on exam.  Patient agrees with plan for discharge at this time and follow-up with primary care physician as needed.  Strict return precautions given and all questions answered prior to discharge  Dispo: Discharge home with PCP follow-up   FINAL CLINICAL IMPRESSION(S) / ED DIAGNOSES   Final diagnoses:  Viral syndrome  Upper respiratory tract infection, unspecified type  Chest pain, unspecified type  Shortness of breath   Rx / DC Orders   ED Discharge Orders     None      Note:  This document was prepared using Dragon voice recognition software and may include unintentional dictation errors.   Lakota Markgraf K, MD 05/04/24 [phone]

## 2024-05-04 NOTE — ED Triage Notes (Signed)
 Pt states can you please do some blood work for me too. I found out I have a bad heart and want to make sure I am ok. Pt states I did have chest pain yesterday, too.

## 2024-05-07 ENCOUNTER — Ambulatory Visit (INDEPENDENT_AMBULATORY_CARE_PROVIDER_SITE_OTHER): Admitting: Family Medicine

## 2024-05-07 ENCOUNTER — Encounter: Payer: Self-pay | Admitting: Family Medicine

## 2024-05-07 VITALS — BP 123/85 | HR 85 | Temp 98.5°F | Resp 20 | Ht 75.0 in | Wt 158.0 lb

## 2024-05-07 DIAGNOSIS — J069 Acute upper respiratory infection, unspecified: Secondary | ICD-10-CM | POA: Diagnosis not present

## 2024-05-07 NOTE — Assessment & Plan Note (Addendum)
 NO sig of sinus infection or pneumonia.  Acute viral syndrome.  Rest and hydrate
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
## 2024-07-01 ENCOUNTER — Emergency Department

## 2024-07-01 ENCOUNTER — Emergency Department
Admission: EM | Admit: 2024-07-01 | Discharge: 2024-07-01 | Disposition: A | Discharge status: Home / Self Care | Attending: Emergency Medicine | Admitting: Emergency Medicine

## 2024-07-01 ENCOUNTER — Other Ambulatory Visit: Payer: Self-pay

## 2024-07-01 ENCOUNTER — Encounter: Payer: Self-pay | Admitting: Emergency Medicine

## 2024-07-01 DIAGNOSIS — W108XXA Fall (on) (from) other stairs and steps, initial encounter: Secondary | ICD-10-CM | POA: Diagnosis not present

## 2024-07-01 DIAGNOSIS — S63501A Unspecified sprain of right wrist, initial encounter: Secondary | ICD-10-CM | POA: Insufficient documentation

## 2024-07-01 DIAGNOSIS — M778 Other enthesopathies, not elsewhere classified: Secondary | ICD-10-CM | POA: Diagnosis not present

## 2024-07-01 DIAGNOSIS — S6991XA Unspecified injury of right wrist, hand and finger(s), initial encounter: Secondary | ICD-10-CM | POA: Diagnosis present

## 2024-07-01 MED ORDER — ACETAMINOPHEN 500 MG PO TABS
1000.0000 mg | ORAL_TABLET | Freq: Four times a day (QID) | ORAL | 0 refills | Status: AC | PRN
Start: 2024-07-01 — End: 2024-07-08

## 2024-07-01 MED ORDER — ACETAMINOPHEN 325 MG PO TABS
650.0000 mg | ORAL_TABLET | Freq: Once | ORAL | Status: AC
  Administered 2024-07-01: 650 mg via ORAL
  Filled 2024-07-01: qty 2

## 2024-07-01 NOTE — ED Triage Notes (Signed)
 Pt to ER states last night he slipped and fell on steps, catching himself on his right wrist.  Pt denies hitting head, denies LOC.  Pt reports pain and swelling to wrist despite use of ice packs today.

## 2024-07-01 NOTE — Discharge Instructions (Addendum)
 You were seen in the emergency department for wrist and elbow pain after a fall.  Fortunately you do not have any fracture or dislocation today.  Please wear the brace for your wrist and elbow Ace wrap as needed for comfort.  Begin gentle exercises of the wrist and elbow as tolerable.  Ice any area that hurts.  I have sent you in a prescription for Tylenol .  Take it as prescribed.  You may also use topical lidocaine  for pain.  Follow-up with your primary care provider following today's visit.  Return to the emergency department for any new, worsening or concerning symptoms.

## 2024-07-01 NOTE — ED Provider Notes (Signed)
 [facility] Provider Note    Event Date/Time   First MD Initiated Contact with Patient 07/01/24 1752     (approximate)   History   Fall and Wrist Pain   HPI  Andrew Sutton is a 53 y.o. male  with a past medical history of Crohn's disease, GERD, heart murmur presents to the emergency department with right wrist and elbow pain after a fall down his steps at 12 AM this morning.  Patient states his steps were slick like the fell down and hit his right elbow and wrist in a FOOSH mechanism.  Patient has not taken anything for pain today.  Patient denies hitting his head, loss consciousness, vision changes, headache, any other injuries at this time.  Patient denies numbness or weakness of his right upper extremity.  No open wound.  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   Physical Exam   Triage Vital Signs: ED Triage Vitals  Encounter Vitals Group     BP 07/01/24 1705 104/73     Girls Systolic BP Percentile --      Girls Diastolic BP Percentile --      Boys Systolic BP Percentile --      Boys Diastolic BP Percentile --      Pulse Rate 07/01/24 1705 73     Resp 07/01/24 1705 17     Temp 07/01/24 1705 98.4 F (36.9 C)     Temp Source 07/01/24 1705 Oral     SpO2 07/01/24 1705 100 %     Weight 07/01/24 1705 175 lb (79.4 kg)     Height 07/01/24 1705 6' 3 (1.905 m)     Head Circumference --      Peak Flow --      Pain Score 07/01/24 1719 8     Pain Loc --      Pain Education --      Exclude from Growth Chart --     Most recent vital signs: Vitals:   07/01/24 1705  BP: 104/73  Pulse: 73  Resp: 17  Temp: 98.4 F (36.9 C)  SpO2: 100%    General: Awake, in no acute distress. Appears stated age. Head: Normocephalic, atraumatic. CV: Good peripheral perfusion. Radial pulses 2+ b/l. Respiratory:Normal respiratory effort.  No respiratory distress.  GI: Soft, non-distended. MSK: Normal ROM and  5/5 strength in b/l upper extremities. TTP  along right anterior ulnar aspect of wrist and right medial elbow. No obvious deformities or swelling. Skin:Warm, dry, intact. No rashes, lesions, or ecchymosis. No cyanosis or pallor. Neurological: A&Ox4 to person, place, time, and situation. Sensation intact. Strength symmetric. No focal deficits.   ED Results / Procedures / Treatments   Labs (all labs ordered are listed, but only abnormal results are displayed) Labs Reviewed - No data to display   EKG     RADIOLOGY X-rays right elbow and wrist ordered.   PROCEDURES:  Critical Care performed: No   Procedures   MEDICATIONS ORDERED IN ED: Medications  acetaminophen  (TYLENOL ) tablet 650 mg (650 mg Oral Given 07/01/24 1815)     IMPRESSION / MDM / ASSESSMENT AND PLAN / ED COURSE  I reviewed the triage vital signs and the nursing notes.                              Differential diagnosis includes, but is not limited to, wrist sprain, wrist dislocation, elbow contusion,  ulnar or radial fracture, enthesopathy  [name] presentation is most consistent with acute complicated illness / injury requiring diagnostic workup.  Clinical Course as of 07/01/24 1927  Wed Jul 01, 2024  1812 Patient here with right wrist, right forearm and right elbow pain after slipping on his steps today around 12 a.m.  He denies hitting his head or loss of consciousness.  He is neurovascularly intact with good strength and range of motion of his shoulders, elbows, wrist and fingers.  He is exquisitely tender along the ulnar aspect of his elbow and wrist.  Will give him a dose of Tylenol .  X-ray of the right wrist was ordered in triage.  Will add elbow and forearm orders as well. [SD]  1911 X ray wrist with no acute findings. X ray elbow with stable enthesopathic changes of the olecranon. [SD]  1919 Patient has no tenderness to his thenar eminence or snuffbox region, will not need follow up x-rays.  Will give wrist brace and elbow ace wrap.  Discussed  Tylenol  use at home for pain and topical lidocaine  cream.  Discussed wrist exercises as tolerable.  Follow-up with his primary care provider as needed. Work note provided. [SD]    Clinical Course User Index [SD] Sheron Salm, PA-C   The patient may return to the emergency department for any new, worsening, or concerning symptoms. Patient was given the opportunity to ask questions; all questions were answered. Emergency department return precautions were discussed with the patient.  Patient is in agreement to the treatment plan.  Patient is stable for discharge.   FINAL CLINICAL IMPRESSION(S) / ED DIAGNOSES   Final diagnoses:  Right wrist sprain, initial encounter  Enthesopathy of right elbow     Rx / DC Orders   ED Discharge Orders          Ordered    acetaminophen  (TYLENOL ) 500 MG tablet  Every 6 hours PRN        07/01/24 1925             Note:  This document was prepared using Dragon voice recognition software and may include unintentional dictation errors.     Sheron Salm, PA-C 07/01/24 1927    Arlander Charleston, MD 07/03/24 1054

## 2024-07-10 ENCOUNTER — Encounter: Payer: Self-pay | Admitting: Family Medicine

## 2024-07-10 ENCOUNTER — Ambulatory Visit (INDEPENDENT_AMBULATORY_CARE_PROVIDER_SITE_OTHER): Admitting: Family Medicine

## 2024-07-10 VITALS — BP 113/72 | HR 65 | Temp 98.0°F | Resp 18 | Ht 75.0 in | Wt 162.0 lb

## 2024-07-10 DIAGNOSIS — S139XXA Sprain of joints and ligaments of unspecified parts of neck, initial encounter: Secondary | ICD-10-CM | POA: Insufficient documentation

## 2024-07-10 DIAGNOSIS — S335XXA Sprain of ligaments of lumbar spine, initial encounter: Secondary | ICD-10-CM | POA: Insufficient documentation

## 2024-07-10 DIAGNOSIS — S63501S Unspecified sprain of right wrist, sequela: Secondary | ICD-10-CM

## 2024-07-10 DIAGNOSIS — S63501A Unspecified sprain of right wrist, initial encounter: Secondary | ICD-10-CM

## 2024-07-10 DIAGNOSIS — M7582 Other shoulder lesions, left shoulder: Secondary | ICD-10-CM | POA: Insufficient documentation

## 2024-07-10 DIAGNOSIS — M19011 Primary osteoarthritis, right shoulder: Secondary | ICD-10-CM | POA: Insufficient documentation

## 2024-07-10 DIAGNOSIS — S4990XA Unspecified injury of shoulder and upper arm, unspecified arm, initial encounter: Secondary | ICD-10-CM | POA: Insufficient documentation

## 2024-07-10 DIAGNOSIS — S43432A Superior glenoid labrum lesion of left shoulder, initial encounter: Secondary | ICD-10-CM | POA: Insufficient documentation

## 2024-07-10 DIAGNOSIS — M75110 Incomplete rotator cuff tear or rupture of unspecified shoulder, not specified as traumatic: Secondary | ICD-10-CM | POA: Insufficient documentation

## 2024-07-10 DIAGNOSIS — M7542 Impingement syndrome of left shoulder: Secondary | ICD-10-CM | POA: Insufficient documentation

## 2024-07-10 NOTE — Assessment & Plan Note (Signed)
 Has a history of an ulcer and Crohn's disease.  Will get diclofenac gel and apply it to your wrist 4 times a day.  Keep your wrist splinted and will follow-up next week.
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
## 2024-07-16 ENCOUNTER — Encounter: Payer: Self-pay | Admitting: Family Medicine

## 2024-07-16 ENCOUNTER — Ambulatory Visit: Admitting: Family Medicine

## 2024-07-16 VITALS — BP 118/81 | HR 59 | Temp 98.2°F | Resp 18 | Ht 75.0 in | Wt 167.0 lb

## 2024-07-16 DIAGNOSIS — S63501S Unspecified sprain of right wrist, sequela: Secondary | ICD-10-CM | POA: Diagnosis not present

## 2024-07-16 NOTE — Assessment & Plan Note (Signed)
 Please wear your splint 24/7 except when you are taking a shower.  Will start physical therapy with [facility] in [place].  Follow-up in a week.

## 2024-07-16 NOTE — Progress Notes (Addendum)
   Established Patient Office Visit  Subjective   Patient ID: Andrew Sutton, male    DOB: 09/11/1970  Age: 53 y.o. MRN: [id number]  Chief Complaint  Patient presents with   Medical Management of Chronic Issues    HPI  53 year old gentleman with severe left wrist sprain.  He fell down a flight of stairs and extended his right hand to catch himself.  He reports that the wrist is still very sore.  He wears a brace to keep it stable when he takes the brace off and moves his hand he has not significant pain in his wrist.  He is not taking any pain medication but using ice and heat. He scored a 12 on his PHQ-9 and a 13 on his GAD-7.  He reports that he is not sad and depressed he is just upset because he cannot go to work.  He does not feel that he needs to talk to anyone about this nor does he feel that he needs medications.  He denies SI and HI.     Objective:     BP 118/81 (BP Location: Left Arm, Patient Position: Sitting, Cuff Size: Normal)   Pulse (!) 59   Temp 98.2 F (36.8 C) (Oral)   Resp 18   Ht 6' 3 (1.905 m)   Wt 167 lb (75.8 kg)   SpO2 98%   BMI 20.87 kg/m    Physical Exam Vitals reviewed.  Constitutional:      Appearance: Normal appearance.  HENT:     Head: Normocephalic.  Eyes:     General:        Right eye: No discharge.        Left eye: No discharge.  Cardiovascular:     Rate and Rhythm: Normal rate.  Pulmonary:     Effort: Pulmonary effort is normal.  Musculoskeletal:     Comments: Swelling on the dorsum of the wrist has improved but is still warm to the touch.  Neurological:     Mental Status: He is alert and oriented to person, place, and time.  Psychiatric:        Mood and Affect: Mood normal.        Behavior: Behavior normal.        Thought Content: Thought content normal.        Judgment: Judgment normal.          No results found for any visits on 07/16/24.    The ASCVD Risk score (Arnett DK, et al., 2019) failed to calculate for the  following reasons:   Cannot find a previous HDL lab   Cannot find a previous total cholesterol lab    Assessment & Plan:  Wrist sprain, right, sequela Assessment & Plan: Please wear your splint 24/7 except when you are taking a shower.  Will start physical therapy with [facility] in [place].  Follow-up in a week.  Orders: -     Ambulatory referral to Physical Therapy     Return in about 1 week (around 07/23/2024).    Hodari Chuba K Airam Heidecker, MD

## 2024-07-22 ENCOUNTER — Telehealth: Payer: Self-pay | Admitting: Family Medicine

## 2024-07-22 NOTE — Telephone Encounter (Signed)
 Patient stated he needs a doctor's note that will cover his extended time out of work starting 08/23/24 until .  He said the doctor does not know when he will return to work.

## 2024-07-22 NOTE — Telephone Encounter (Signed)
 Patient has been informed that letter will be placed up front for pick up.

## 2024-07-27 ENCOUNTER — Ambulatory Visit: Admitting: Family Medicine

## 2024-07-28 ENCOUNTER — Ambulatory Visit: Payer: Self-pay

## 2024-07-28 ENCOUNTER — Encounter: Payer: Self-pay | Admitting: Family Medicine

## 2024-07-28 ENCOUNTER — Ambulatory Visit: Admitting: Family Medicine

## 2024-07-28 VITALS — BP 100/58 | HR 66 | Ht 75.0 in | Wt 167.0 lb

## 2024-07-28 DIAGNOSIS — S63501S Unspecified sprain of right wrist, sequela: Secondary | ICD-10-CM | POA: Diagnosis not present

## 2024-07-28 DIAGNOSIS — S63501D Unspecified sprain of right wrist, subsequent encounter: Secondary | ICD-10-CM | POA: Diagnosis not present

## 2024-07-28 NOTE — Telephone Encounter (Signed)
 FYI Only or Action Required?: FYI only for provider: appointment scheduled on Today.  Patient was last seen in primary care on 07/16/2024 by Ziglar, Susan K, MD.  Called Nurse Triage reporting No chief complaint on file..  Symptoms began several weeks ago.  Interventions attempted: OTC medications: Tylenol , Ibuprofen .  Symptoms are: gradually worsening.  Triage Disposition: See HCP Within 4 Hours (Or PCP Triage)  Patient/caregiver understands and will follow disposition?: Yes  Copied from CRM #[id number]. Topic: Clinical - Red Word Triage >> Jul 28, 2024  8:15 AM Zy'onna H wrote: [facility] that prompted transfer to Nurse Triage: Patient is still continuing to have pain in his right wrist - patient stating it is now popping.   Swelling   Looking to get appt.   **Transf to NT** >> Jul 28, 2024  8:19 AM Zy'onna H wrote: Patient initially called in to reschedule his appointment with his PCP that he missed yesterday: 07/27/2024 (He thought the appt was today.)  After describing he was still having pain/discomfort/and swelling in his rt. Wrist, I cancelled the DT and decided NT would be better suited.  Reason for Disposition  [1] SEVERE pain (e.g., excruciating, unable to use wrist at all) AND [2] not improved after 2 hours of pain medicine  Answer Assessment - Initial Assessment Questions 1. ONSET: When did the pain start?     Since Friday  2. LOCATION: Where is the pain located?     Right Wrist  3. PAIN: How bad is the pain? (Scale 1-10; or mild, moderate, severe)     10  4. WORK OR EXERCISE: Has there been any recent work or exercise that involved this part (i.e., hand or wrist) of the body?      No  5. CAUSE: What do you think is causing the pain?     Recent fall down the steps  6. AGGRAVATING FACTORS: What makes the pain worse? (e.g., using computer)     Unable to use  7. OTHER SYMPTOMS: Do you have any other symptoms? (e.g., fever, neck pain, numbness or  tingling, rash, swelling)     Denies  Protocols used: Wrist Pain-A-AH

## 2024-07-28 NOTE — Assessment & Plan Note (Signed)
 Still with edema and pain with movement.  Starts physical therapy today.  Note out of work until his next  08/18/24

## 2024-07-28 NOTE — Progress Notes (Signed)
   Established Patient Office Visit  Subjective   Patient ID: Andrew Sutton, male    DOB: Jan 26, 1971  Age: 53 y.o. MRN: [id number]  Chief Complaint  Patient presents with   Wrist Pain    Took a fall down the steps few weeks ago - is here for a follow-up - still experiencing the same pain - no swelling - popping sensation Top of right hand and all around the right wrist  Is using brace and cream given     Wrist Pain    He reports that he starts physical therapy this afternoon.  He can move his wrist a little bit but then he will have lancing pains.  Wrote him out of work for the next 3 weeks.  Next evaluation December 23.   Objective:     BP (!) 100/58 (BP Location: Left Arm, Patient Position: Sitting, Cuff Size: Normal)   Pulse 66   Ht 6' 3 (1.905 m)   Wt 167 lb (75.8 kg)   SpO2 97%   BMI 20.87 kg/m    Physical Exam Vitals reviewed.  Constitutional:      Appearance: Normal appearance.  HENT:     Head: Normocephalic.  Eyes:     General:        Right eye: No discharge.        Left eye: No discharge.  Cardiovascular:     Rate and Rhythm: Normal rate.  Pulmonary:     Effort: Pulmonary effort is normal.  Musculoskeletal:     Comments: Right wrist still warm and swollen.  Can extend and flex very slightly.  Still very painful.  Neurological:     Mental Status: He is alert and oriented to person, place, and time.  Psychiatric:        Mood and Affect: Mood normal.        Behavior: Behavior normal.        Thought Content: Thought content normal.        Judgment: Judgment normal.          No results found for any visits on 07/28/24.    The ASCVD Risk score (Arnett DK, et al., 2019) failed to calculate for the following reasons:   Cannot find a previous HDL lab   Cannot find a previous total cholesterol lab    Assessment & Plan:  There are no diagnoses linked to this encounter.   No follow-ups on file.    Iziah Cates K Dam Ashraf, MD

## 2024-08-18 ENCOUNTER — Ambulatory Visit: Admitting: Family Medicine

## 2024-08-18 ENCOUNTER — Encounter: Payer: Self-pay | Admitting: Family Medicine

## 2024-08-18 DIAGNOSIS — S63501D Unspecified sprain of right wrist, subsequent encounter: Secondary | ICD-10-CM

## 2024-08-18 DIAGNOSIS — S63501S Unspecified sprain of right wrist, sequela: Secondary | ICD-10-CM | POA: Diagnosis not present

## 2024-08-18 MED ORDER — MELOXICAM 15 MG PO TABS: 15.0000 mg | ORAL_TABLET | Freq: Every day | ORAL | 1 refills | Status: AC

## 2024-08-18 NOTE — Progress Notes (Signed)
 "  Established Patient Office Visit  Subjective   Patient ID: Andrew Sutton, male    DOB: 1971/05/08  Age: 53 y.o. MRN: [id number]  No chief complaint on file.   HPI  Discussed the use of AI scribe software for clinical note transcription with the patient, who gave verbal consent to proceed.  History of Present Illness   Andrew Sutton is a 53 year old male who presents with wrist pain and weakness following a recent injury.  He experienced significant swelling in his wrist after the injury, which initially caused severe pain and limited mobility. The swelling has since decreased, and he reports that the wrist feels much better now, although he experiences occasional popping depending on his activities.  He mentions a lack of power in his wrist, particularly when shaking hands, and describes it as still somewhat sore. Despite improvements in movement, he still feels weakness and is concerned about his ability to return to work. He reports performing exercises at home, including squeezing a ball.  He has been taking meloxicam  for inflammation, which he finds helpful, but he has run out of the medication. He went to PT one time and got a list of exercises to do at home.  He has been doing the exercises at home daily.  He expresses financial concerns about affording physical therapy and other medical expenses, noting that he has not worked in a month and has limited funds.       Objective:     BP 110/76   Pulse 65   Resp 16   Ht 6' 3 (1.905 m)   Wt 170 lb (77.1 kg)   SpO2 96%   BMI 21.25 kg/m    Physical Exam Vitals and nursing note reviewed.  Constitutional:      Appearance: Normal appearance.  HENT:     Head: Normocephalic and atraumatic.  Eyes:     Conjunctiva/sclera: Conjunctivae normal.  Cardiovascular:     Rate and Rhythm: Normal rate and regular rhythm.  Pulmonary:     Effort: Pulmonary effort is normal.     Breath sounds: Normal breath sounds.   Musculoskeletal:     Right lower leg: No edema.     Left lower leg: No edema.     Comments: No swelling in his right wrist today.  FROM of wrist.  Grip strength is 4/5.  Skin:    General: Skin is warm and dry.  Neurological:     Mental Status: He is alert and oriented to person, place, and time.  Psychiatric:        Mood and Affect: Mood normal.        Behavior: Behavior normal.        Thought Content: Thought content normal.        Judgment: Judgment normal.          No results found for any visits on 08/18/24.    The ASCVD Risk score (Arnett DK, et al., 2019) failed to calculate for the following reasons:   Cannot find a previous HDL lab   Cannot find a previous total cholesterol lab   * - Cholesterol units were assumed    Assessment & Plan:  Wrist sprain, right, sequela Assessment & Plan: Continue doing wrist and hand exercises.  Return to full duty January 5th 2026.  If any difficulties RTC or contact Via MyChart.    Orders: -     Meloxicam ; Take 1 tablet (15 mg total) by mouth daily.  Dispense: 30 tablet; Refill: 1     Return if symptoms worsen or fail to improve.    Lamount Bankson K Hanah Moultry, MD "

## 2024-08-18 NOTE — Assessment & Plan Note (Signed)
 Continue doing wrist and hand exercises.  Return to full duty January 5th 2026.  If any difficulties RTC or contact Via MyChart.
